# Patient Record
Sex: Male | Born: 1975 | Race: Black or African American | Hispanic: No | Marital: Married | Smoking: Former smoker
Health system: Southern US, Community
[De-identification: ages and names within clinical notes are randomized; demographics above are authoritative.]

## PROBLEM LIST (undated history)

## (undated) DIAGNOSIS — I10 Essential (primary) hypertension: Secondary | ICD-10-CM

---

## 2000-01-07 ENCOUNTER — Emergency Department (HOSPITAL_COMMUNITY): Admission: EM | Admit: 2000-01-07 | Discharge: 2000-01-08 | Payer: Self-pay | Admitting: Emergency Medicine

## 2000-01-07 ENCOUNTER — Encounter: Payer: Self-pay | Admitting: Emergency Medicine

## 2004-07-14 ENCOUNTER — Emergency Department (HOSPITAL_COMMUNITY): Admission: EM | Admit: 2004-07-14 | Discharge: 2004-07-14 | Payer: Self-pay | Admitting: Family Medicine

## 2007-05-08 ENCOUNTER — Emergency Department (HOSPITAL_COMMUNITY): Admission: EM | Admit: 2007-05-08 | Discharge: 2007-05-09 | Payer: Self-pay | Admitting: Emergency Medicine

## 2008-04-29 ENCOUNTER — Emergency Department (HOSPITAL_COMMUNITY): Admission: EM | Admit: 2008-04-29 | Discharge: 2008-04-29 | Payer: Self-pay | Admitting: Emergency Medicine

## 2009-06-25 ENCOUNTER — Emergency Department (HOSPITAL_COMMUNITY): Admission: EM | Admit: 2009-06-25 | Discharge: 2009-06-25 | Payer: Self-pay | Admitting: Family Medicine

## 2010-11-09 ENCOUNTER — Inpatient Hospital Stay (INDEPENDENT_AMBULATORY_CARE_PROVIDER_SITE_OTHER)
Admission: RE | Admit: 2010-11-09 | Discharge: 2010-11-09 | Disposition: A | Discharge status: Home / Self Care | Payer: Self-pay | Source: Ambulatory Visit | Attending: Family Medicine | Admitting: Family Medicine

## 2010-11-09 DIAGNOSIS — S335XXA Sprain of ligaments of lumbar spine, initial encounter: Secondary | ICD-10-CM

## 2011-04-27 ENCOUNTER — Emergency Department (HOSPITAL_COMMUNITY): Payer: Self-pay

## 2011-04-27 ENCOUNTER — Emergency Department (HOSPITAL_COMMUNITY)
Admission: EM | Admit: 2011-04-27 | Discharge: 2011-04-27 | Disposition: A | Discharge status: Home / Self Care | Payer: Self-pay | Attending: Emergency Medicine | Admitting: Emergency Medicine

## 2011-04-27 ENCOUNTER — Encounter (HOSPITAL_COMMUNITY): Payer: Self-pay | Admitting: *Deleted

## 2011-04-27 DIAGNOSIS — F17200 Nicotine dependence, unspecified, uncomplicated: Secondary | ICD-10-CM | POA: Insufficient documentation

## 2011-04-27 DIAGNOSIS — R109 Unspecified abdominal pain: Secondary | ICD-10-CM | POA: Insufficient documentation

## 2011-04-27 LAB — URINALYSIS, ROUTINE W REFLEX MICROSCOPIC
Bilirubin Urine: NEGATIVE
Hgb urine dipstick: NEGATIVE
Specific Gravity, Urine: 1.02 (ref 1.005–1.030)
pH: 5.5 (ref 5.0–8.0)

## 2011-04-27 MED ORDER — HYDROCODONE-ACETAMINOPHEN 5-500 MG PO TABS: 1.0000 | ORAL_TABLET | Freq: Four times a day (QID) | ORAL | Status: AC | PRN

## 2011-04-27 MED ORDER — KETOROLAC TROMETHAMINE 60 MG/2ML IM SOLN
60.0000 mg | Freq: Once | INTRAMUSCULAR | Status: AC
  Administered 2011-04-27: 60 mg via INTRAMUSCULAR
  Filled 2011-04-27: qty 2

## 2011-04-27 NOTE — ED Notes (Signed)
The pt has had  Lt flank pain since he sneezed approx  One hour ago

## 2011-04-27 NOTE — ED Notes (Signed)
Pt reports having (L) sided flank pain since sneezing tonight.  Reports that he felt a pop.  Pt tender on palpation, tender when moving.  Skin warm, dry and intact.  Denies urinary symptoms.

## 2011-04-27 NOTE — Discharge Instructions (Signed)
Flank Pain Flank pain refers to pain that is located on the side of the body between the upper abdomen and the back. It can be caused by many things. CAUSES  Some of the more common causes of flank pain include:  Muscle strain.   Muscle spasms.   A disease of your spine (vertebral disk disease).   A lung infection (pneumonia).   Fluid around your lungs (pulmonary edema).   A kidney infection.   Kidney stones.   A very painful skin rash on only one side of your body (shingles).   Gallbladder disease.  DIAGNOSIS  Blood tests, urine tests, and X-rays may help your caregiver determine what is wrong. TREATMENT  The treatment of pain depends on the cause. Your caregiver will determine what treatment will work best for you. HOME CARE INSTRUCTIONS   Home care will depend on the cause of your pain.   Some medications may help relieve the pain. Take medication for relief of pain as directed by your caregiver.   Tell your caregiver about any changes in your pain.   Follow up with your caregiver.  SEEK IMMEDIATE MEDICAL CARE IF:   Your pain is not controlled with medication.   The pain increases.   You have abdominal pain.   You have shortness of breath.   You have persistent nausea or vomiting.   You have swelling in your abdomen.   You feel faint or pass out.   You have a temperature by mouth above 102 F (38.9 C), not controlled by medicine.  MAKE SURE YOU:   Understand these instructions.   Will watch your condition.   Will get help right away if you are not doing well or get worse.  Document Released: 03/17/2005 Document Revised: 01/13/2011 Document Reviewed: 07/11/2009 [employer] Patient Information 2012 [employer]. 

## 2011-04-27 NOTE — ED Provider Notes (Signed)
History     CSN: [id number]  Arrival date & time 04/27/11  0143   First MD Initiated Contact with Patient 04/27/11 0500      Chief Complaint  Patient presents with  . Flank Pain    (Consider location/radiation/quality/duration/timing/severity/associated sxs/prior treatment) Patient is a 36 y.o. male presenting with flank pain. The history is provided by the patient.  Flank Pain This is a new problem. The current episode started 6 to 12 hours ago. The problem occurs constantly. The problem has not changed since onset.Exacerbated by: sneeze, cough, deep breath. The symptoms are relieved by nothing. He has tried nothing for the symptoms.    History reviewed. No pertinent past medical history.  History reviewed. No pertinent past surgical history.  History reviewed. No pertinent family history.  History  Substance Use Topics  . Smoking status: Current Everyday Smoker  . Smokeless tobacco: Not on file  . Alcohol Use: Yes      Review of Systems  Genitourinary: Positive for flank pain.  All other systems reviewed and are negative.    Allergies  Review of patient's allergies indicates no known allergies.  Home Medications   Current Outpatient Rx  Name Route Sig Dispense Refill  . ASPIRIN-CAFFEINE 500-32.5 MG PO TABS Oral Take 1 tablet by mouth 2 (two) times daily as needed. For pain      BP 138/90  Pulse 74  Temp(Src) 97.4 F (36.3 C) (Oral)  Resp 20  SpO2 99%  Physical Exam  Nursing note and vitals reviewed. Constitutional: He appears well-developed and well-nourished. No distress.  HENT:  Head: Normocephalic and atraumatic.  Neck: Normal range of motion. Neck supple.  Cardiovascular: Normal rate and regular rhythm.   Pulmonary/Chest: Effort normal and breath sounds normal.  Abdominal: Soft. Bowel sounds are normal.  Musculoskeletal: Normal range of motion.  Neurological: He is alert.  Skin: Skin is warm and dry. He is not diaphoretic.    ED Course    Procedures (including critical care time)  Labs Reviewed - No data to display No results found.   No diagnosis found.    MDM  The ct looks okay.  The pain appears musculoskeletal in nature.  He will be discharged to home with pain meds, time.  To return prn if worsens.        Geoffery Lyons, MD 04/27/11 [phone]

## 2011-11-18 ENCOUNTER — Emergency Department (INDEPENDENT_AMBULATORY_CARE_PROVIDER_SITE_OTHER)
Admission: EM | Admit: 2011-11-18 | Discharge: 2011-11-18 | Disposition: A | Discharge status: Home / Self Care | Payer: Self-pay | Source: Home / Self Care | Attending: Emergency Medicine | Admitting: Emergency Medicine

## 2011-11-18 ENCOUNTER — Emergency Department (HOSPITAL_COMMUNITY)
Admit: 2011-11-18 | Discharge: 2011-11-18 | Disposition: A | Discharge status: Home / Self Care | Payer: Self-pay | Source: Ambulatory Visit | Attending: Emergency Medicine | Admitting: Emergency Medicine

## 2011-11-18 ENCOUNTER — Encounter (HOSPITAL_COMMUNITY): Payer: Self-pay | Admitting: Emergency Medicine

## 2011-11-18 DIAGNOSIS — M7989 Other specified soft tissue disorders: Secondary | ICD-10-CM | POA: Insufficient documentation

## 2011-11-18 DIAGNOSIS — M25519 Pain in unspecified shoulder: Secondary | ICD-10-CM | POA: Insufficient documentation

## 2011-11-18 DIAGNOSIS — W19XXXA Unspecified fall, initial encounter: Secondary | ICD-10-CM | POA: Insufficient documentation

## 2011-11-18 DIAGNOSIS — S93409A Sprain of unspecified ligament of unspecified ankle, initial encounter: Secondary | ICD-10-CM

## 2011-11-18 MED ORDER — HYDROMORPHONE HCL PF 1 MG/ML IJ SOLN
INTRAMUSCULAR | Status: AC
  Filled 2011-11-18: qty 2

## 2011-11-18 MED ORDER — OXYCODONE-ACETAMINOPHEN 5-325 MG PO TABS: ORAL_TABLET | ORAL | Status: DC

## 2011-11-18 MED ORDER — ONDANSETRON HCL 4 MG/2ML IJ SOLN
4.0000 mg | Freq: Once | INTRAMUSCULAR | Status: AC
  Administered 2011-11-18: 4 mg via INTRAMUSCULAR

## 2011-11-18 MED ORDER — ONDANSETRON HCL 4 MG/2ML IJ SOLN
INTRAMUSCULAR | Status: AC
  Filled 2011-11-18: qty 2

## 2011-11-18 MED ORDER — HYDROMORPHONE HCL PF 1 MG/ML IJ SOLN
2.0000 mg | Freq: Once | INTRAMUSCULAR | Status: AC
  Administered 2011-11-18: 2 mg via INTRAMUSCULAR

## 2011-11-18 NOTE — ED Notes (Signed)
Discussed ordered medications: hydromorphone and zofran with ramon, cma and ordering physician, dr Lorenz Coaster.

## 2011-11-18 NOTE — ED Notes (Signed)
Pt c/o left foot injy since 13:30.. Says that cement blocks (about 100lbs) fell on his left foot... Pt twisted his ankle... Sx include: swelling, tender, unsteady gait, unable to put pressure, limited ROM.

## 2011-11-18 NOTE — ED Provider Notes (Signed)
Chief Complaint  Patient presents with  . Foot Injury    History of Present Illness:   Victor Dominguez is a 36 year old male who injured his left ankle and foot today. He was helping a family member doing some digging, when a large piece of concrete and on his foot and ankle, twisting his ankle and bending it into inversion. Has been swollen since then over the lateral foot and lateral malleolus. He's unable to bear weight. The pain is severe. He denies any numbness or tingling. It hurts to move the ankle.  Review of Systems:  Other than noted above, the patient denies any of the following symptoms: Systemic:  No fevers, chills, sweats, or aches.  No fatigue or tiredness. Musculoskeletal:  No joint pain, arthritis, bursitis, swelling, back pain, or neck pain. Neurological:  No muscular weakness, paresthesias, headache, or trouble with speech or coordination.  No dizziness.  PMFSH:  Past medical history, family history, social history, meds, and allergies were reviewed.  Physical Exam:   Vital signs:  BP 164/92  Pulse 93  Temp 98.5 F (36.9 C) (Oral)  Resp 16  SpO2 97% Gen:  Alert and oriented times 3.  In no distress. Musculoskeletal: There is tenderness to palpation over the tip of the lateral malleolus along with some swelling but no bruising or deformity. There is also some swelling and pain to palpation over the lateral aspect of the foot, at the base of the fifth metatarsal. There is no pain medially. It hurts to move the ankle. Pulses are full. Sensation is intact. He has good capillary refill. Otherwise, all joints had a full a ROM with no swelling, bruising or deformity.  No edema, pulses full. Extremities were warm and pink.  Capillary refill was brisk.  Skin:  Clear, warm and dry.  No rash. Neuro:  Alert and oriented times 3.  Muscle strength was normal.  Sensation was intact to light touch.   Radiology:  Dg Ankle Complete Left  11/18/2011  *RADIOLOGY REPORT*  Clinical Data: Fall,  trauma, pain  LEFT ANKLE COMPLETE - 3+ VIEW  Comparison: None.  Findings: Mild swelling laterally.  Normal alignment without fracture.  Intact malleoli, talus and calcaneus.  IMPRESSION: Lateral swelling.  No acute osseous abnormality   Original Report Authenticated By: Judie Petit. Ruel Favors, M.D.    Dg Foot Complete Left  11/18/2011  *RADIOLOGY REPORT*  Clinical Data: Fall, trauma  LEFT FOOT - COMPLETE 3+ VIEW  Comparison: None.  Findings: Normal alignment.  No fracture.  Preserved joint spaces. No radiographic soft tissue swelling or foreign body demonstrated.  IMPRESSION: No acute finding.   Original Report Authenticated By: Judie Petit. Ruel Favors, M.D.    I reviewed the images independently and personally and concur with the radiologist's findings.  Course in Urgent Care Center:   He was put in an ASO brace and given crutches.  Assessment:  The encounter diagnosis was Ankle sprain.  Plan:   1.  The following meds were prescribed:   New Prescriptions   OXYCODONE-ACETAMINOPHEN (PERCOCET) 5-325 MG PER TABLET    1 to 2 tablets every 6 hours as needed for pain.   2.  The patient was instructed in symptomatic care, including rest and activity, elevation, application of ice and compression.  Appropriate handouts were given. 3.  The patient was told to return if becoming worse in any way, if no better in 3 or 4 days, and given some red flag symptoms that would indicate earlier return.   4.  The patient was told to follow up with Dr. Venita Dominguez if no better in 2 weeks.  Victor Likes, MD 11/18/11 2106

## 2012-10-05 ENCOUNTER — Emergency Department (HOSPITAL_COMMUNITY): Payer: Self-pay

## 2012-10-05 ENCOUNTER — Encounter (HOSPITAL_COMMUNITY): Payer: Self-pay | Admitting: Emergency Medicine

## 2012-10-05 ENCOUNTER — Emergency Department (HOSPITAL_COMMUNITY)
Admission: EM | Admit: 2012-10-05 | Discharge: 2012-10-05 | Disposition: A | Discharge status: Home / Self Care | Payer: Self-pay | Attending: Emergency Medicine | Admitting: Emergency Medicine

## 2012-10-05 DIAGNOSIS — N200 Calculus of kidney: Secondary | ICD-10-CM | POA: Insufficient documentation

## 2012-10-05 DIAGNOSIS — F17200 Nicotine dependence, unspecified, uncomplicated: Secondary | ICD-10-CM | POA: Insufficient documentation

## 2012-10-05 LAB — BASIC METABOLIC PANEL
CO2: 21 mEq/L (ref 19–32)
Calcium: 8.6 mg/dL (ref 8.4–10.5)
Chloride: 106 mEq/L (ref 96–112)
Sodium: 138 mEq/L (ref 135–145)

## 2012-10-05 LAB — URINALYSIS W MICROSCOPIC + REFLEX CULTURE
Glucose, UA: NEGATIVE mg/dL
Specific Gravity, Urine: 1.014 (ref 1.005–1.030)

## 2012-10-05 MED ORDER — ONDANSETRON HCL 4 MG/2ML IJ SOLN
4.0000 mg | Freq: Once | INTRAMUSCULAR | Status: AC
  Administered 2012-10-05: 4 mg via INTRAVENOUS
  Filled 2012-10-05: qty 2

## 2012-10-05 MED ORDER — HYDROMORPHONE HCL PF 1 MG/ML IJ SOLN
1.0000 mg | Freq: Once | INTRAMUSCULAR | Status: AC
  Administered 2012-10-05: 1 mg via INTRAVENOUS
  Filled 2012-10-05: qty 1

## 2012-10-05 MED ORDER — KETOROLAC TROMETHAMINE 30 MG/ML IJ SOLN
30.0000 mg | Freq: Once | INTRAMUSCULAR | Status: AC
  Administered 2012-10-05: 30 mg via INTRAVENOUS
  Filled 2012-10-05: qty 1

## 2012-10-05 MED ORDER — OXYCODONE-ACETAMINOPHEN 5-325 MG PO TABS: 1.0000 | ORAL_TABLET | ORAL | Status: DC | PRN

## 2012-10-05 MED ORDER — SODIUM CHLORIDE 0.9 % IV BOLUS (SEPSIS)
1000.0000 mL | INTRAVENOUS | Status: AC
  Administered 2012-10-05: 1000 mL via INTRAVENOUS

## 2012-10-05 MED ORDER — TAMSULOSIN HCL 0.4 MG PO CAPS: 0.4000 mg | ORAL_CAPSULE | Freq: Every day | ORAL | Status: DC

## 2012-10-05 MED ORDER — HYDROMORPHONE HCL PF 1 MG/ML IJ SOLN
1.0000 mg | INTRAMUSCULAR | Status: AC
  Administered 2012-10-05: 1 mg via INTRAVENOUS
  Filled 2012-10-05: qty 1

## 2012-10-05 NOTE — ED Notes (Addendum)
Pt states decreased nausea but no relief of pain with dilaudid.  Dr Romeo Apple notified.

## 2012-10-05 NOTE — ED Notes (Signed)
Pt has a urinal and knows we need a urine sample from him 

## 2012-10-05 NOTE — ED Provider Notes (Signed)
CSN: [id number]     Arrival date & time 10/05/12  4098 History   First MD Initiated Contact with Patient 10/05/12 1008     Chief Complaint  Patient presents with  . Flank Pain   (Consider location/radiation/quality/duration/timing/severity/associated sxs/prior Treatment) Patient is a 37 y.o. male presenting with flank pain. The history is provided by the patient.  Flank Pain This is a new problem. The current episode started 1 to 2 hours ago. The problem occurs constantly. The problem has not changed since onset.Pertinent negatives include no chest pain, no abdominal pain, no headaches and no shortness of breath. Nothing aggravates the symptoms. Nothing relieves the symptoms. He has tried nothing for the symptoms. The treatment provided no relief.    History reviewed. No pertinent past medical history. History reviewed. No pertinent past surgical history. History reviewed. No pertinent family history. History  Substance Use Topics  . Smoking status: Current Every Day Smoker  . Smokeless tobacco: Not on file  . Alcohol Use: Yes    Review of Systems  Constitutional: Negative for fever.  HENT: Negative for rhinorrhea, drooling and neck pain.   Eyes: Negative for pain.  Respiratory: Negative for cough and shortness of breath.   Cardiovascular: Negative for chest pain and leg swelling.  Gastrointestinal: Negative for nausea, vomiting, abdominal pain and diarrhea.  Genitourinary: Positive for flank pain. Negative for dysuria and hematuria.  Musculoskeletal: Negative for gait problem.  Skin: Negative for color change.  Neurological: Negative for numbness and headaches.  Hematological: Negative for adenopathy.  Psychiatric/Behavioral: Negative for behavioral problems.  All other systems reviewed and are negative.    Allergies  Review of patient's allergies indicates no known allergies.  Home Medications   Current Outpatient Rx  Name  Route  Sig  Dispense  Refill  .  Aspirin-Caffeine (BAYER BACK & BODY PAIN EX ST) 500-32.5 MG TABS   Oral   Take 1 tablet by mouth 2 (two) times daily as needed. For pain         . oxyCODONE-acetaminophen (PERCOCET) 5-325 MG per tablet      1 to 2 tablets every 6 hours as needed for pain.   20 tablet   0    BP 177/111  Pulse 74  Temp(Src) 98.3 F (36.8 C) (Oral)  Resp 14  Ht 6\' 4"  (1.93 m)  Wt 240 lb (108.863 kg)  BMI 29.23 kg/m2  SpO2 99% Physical Exam  Nursing note and vitals reviewed. Constitutional: He is oriented to person, place, and time. He appears well-developed and well-nourished.  HENT:  Head: Normocephalic and atraumatic.  Right Ear: External ear normal.  Left Ear: External ear normal.  Nose: Nose normal.  Mouth/Throat: Oropharynx is clear and moist. No oropharyngeal exudate.  Eyes: Conjunctivae and EOM are normal. Pupils are equal, round, and reactive to light.  Neck: Normal range of motion. Neck supple.  Cardiovascular: Normal rate, regular rhythm, normal heart sounds and intact distal pulses.  Exam reveals no gallop and no friction rub.   No murmur heard. Pulmonary/Chest: Effort normal and breath sounds normal. No respiratory distress. He has no wheezes.  Abdominal: Soft. Bowel sounds are normal. He exhibits no distension. There is no tenderness. There is no rebound and no guarding.  Musculoskeletal: Normal range of motion. He exhibits no edema and no tenderness.  No CVA ttp.   Neurological: He is alert and oriented to person, place, and time.  Skin: Skin is warm and dry.  Psychiatric: He has a normal mood and  affect. His behavior is normal.    ED Course  Procedures (including critical care time) Labs Review Labs Reviewed  URINALYSIS W MICROSCOPIC + REFLEX CULTURE - Abnormal; Notable for the following:    Hgb urine dipstick MODERATE (*)    Ketones, ur 15 (*)    All other components within normal limits  BASIC METABOLIC PANEL - Abnormal; Notable for the following:    Glucose, Bld 126  (*)    All other components within normal limits  CBC WITH DIFFERENTIAL   Imaging Review Ct Abdomen Pelvis Wo Contrast  10/05/2012   *RADIOLOGY REPORT*  Clinical Data: Right flank pain  CT ABDOMEN AND PELVIS WITHOUT CONTRAST  Technique:  Multidetector CT imaging of the abdomen and pelvis was performed following the standard protocol without intravenous contrast.  Comparison: April 27, 2011.  Findings: 6 mm nodule is seen laterally in the left lower lobe which is unchanged compared to prior exam.  No abnormalities seen involve the liver, spleen or pancreas.  No gallstones are noted. Adrenal glands appear normal.  Left kidney appears normal.  Minimal right hydroureteronephrosis is noted secondary to a 3 mm calculus at the right ureterovesical junction.  Urinary bladder appears normal.  The appendix appears normal.  Fat containing left inguinal hernia is noted which is unchanged compared to prior exam.  IMPRESSION: Stable 6 mm nodule seen in the left lower lobe since March, 2013; unenhanced follow-up chest CT scan in 6-12 months is recommended. Minimal right hydroureteronephrosis is noted secondary to a 3 mm calculus at the right ureterovesical junction.  Stable fat containing left inguinal hernia compared to prior exam.   Original Report Authenticated By: Lupita Raider.,  M.D.    MDM   1. Nephrolithiasis    38 year old male who presents with sudden onset right flank pain and proximally and this morning. The patient is afebrile and vital signs are unremarkable here. No focal tenderness on exam. No history of kidney stones but will workup for this.  2:37 PM: Cbc hemolyzed and not redrawn. Do not think this would change pt's care. UA neg for infection, pt continues to appear well. I have discussed the diagnosis/risks/treatment options with the patient and believe the pt to be eligible for discharge home to follow-up with Urology as needed. We also discussed returning to the ED immediately if new or  worsening sx occur. We discussed the sx which are most concerning (e.g., worsening pain, vomiting, fever) that necessitate immediate return. Any new prescriptions provided to the patient are listed below.  New Prescriptions   OXYCODONE-ACETAMINOPHEN (PERCOCET) 5-325 MG PER TABLET    Take 1 tablet by mouth every 4 (four) hours as needed for pain.   TAMSULOSIN (FLOMAX) 0.4 MG CAPS CAPSULE    Take 1 capsule (0.4 mg total) by mouth daily.     Junius Argyle, MD 10/05/12 [phone]

## 2012-10-05 NOTE — ED Notes (Signed)
Pt c/o right sided flank pain starting this am; pt diaphoretic and laying in floor upon my arrival moaning

## 2016-11-13 ENCOUNTER — Encounter (HOSPITAL_COMMUNITY): Payer: Self-pay | Admitting: *Deleted

## 2016-11-13 ENCOUNTER — Emergency Department (HOSPITAL_COMMUNITY): Payer: Self-pay

## 2016-11-13 ENCOUNTER — Emergency Department (HOSPITAL_COMMUNITY)
Admission: EM | Admit: 2016-11-13 | Discharge: 2016-11-13 | Disposition: A | Discharge status: Home / Self Care | Payer: Self-pay | Attending: Physician Assistant | Admitting: Physician Assistant

## 2016-11-13 DIAGNOSIS — Y999 Unspecified external cause status: Secondary | ICD-10-CM | POA: Insufficient documentation

## 2016-11-13 DIAGNOSIS — I10 Essential (primary) hypertension: Secondary | ICD-10-CM | POA: Insufficient documentation

## 2016-11-13 DIAGNOSIS — M5442 Lumbago with sciatica, left side: Secondary | ICD-10-CM | POA: Insufficient documentation

## 2016-11-13 DIAGNOSIS — Y939 Activity, unspecified: Secondary | ICD-10-CM | POA: Insufficient documentation

## 2016-11-13 DIAGNOSIS — F17200 Nicotine dependence, unspecified, uncomplicated: Secondary | ICD-10-CM | POA: Insufficient documentation

## 2016-11-13 DIAGNOSIS — Y929 Unspecified place or not applicable: Secondary | ICD-10-CM | POA: Insufficient documentation

## 2016-11-13 DIAGNOSIS — Z79899 Other long term (current) drug therapy: Secondary | ICD-10-CM | POA: Insufficient documentation

## 2016-11-13 MED ORDER — IBUPROFEN 400 MG PO TABS
600.0000 mg | ORAL_TABLET | Freq: Once | ORAL | Status: AC
  Administered 2016-11-13: 600 mg via ORAL
  Filled 2016-11-13: qty 1

## 2016-11-13 MED ORDER — CYCLOBENZAPRINE HCL 10 MG PO TABS
10.0000 mg | ORAL_TABLET | Freq: Once | ORAL | Status: AC
  Administered 2016-11-13: 10 mg via ORAL
  Filled 2016-11-13: qty 1

## 2016-11-13 MED ORDER — CYCLOBENZAPRINE HCL 10 MG PO TABS: 10.0000 mg | ORAL_TABLET | Freq: Every evening | ORAL | 0 refills | Status: DC | PRN

## 2016-11-13 NOTE — Discharge Instructions (Signed)
While in the ED your blood pressure was high.  Please follow up with your primary care doctor or the wellness clinic for repeat evaluation as you may need medication.  High blood pressure can cause long term, potentially serious, damage if left untreated.   Please take Ibuprofen (Advil, motrin) and Tylenol (acetaminophen) to relieve your pain.  You may take up to 600 MG (3 pills) of normal strength ibuprofen every 8 hours as needed.  In between doses of ibuprofen you make take tylenol, up to 1,000 mg (two extra strength pills).  Do not take more than 3,000 mg tylenol in a 24 hour period.  Please check all medication labels as many medications such as pain and cold medications may contain tylenol.  Do not drink alcohol while taking these medications.  Do not take other NSAID'S while taking ibuprofen (such as aleve or naproxen).  Please take ibuprofen with food to decrease stomach upset.  Today you received medications that may make you sleepy or impair your ability to make decisions.  For the next 24 hours please do not drive, operate heavy machinery, care for a small child with out another adult present, or perform any activities that may cause harm to you or someone else if you were to fall asleep or be impaired.   You are being prescribed a medication which may make you sleepy. Please follow up of listed precautions for at least 24 hours after taking one dose.  The best way to get rid of muscle pain is by taking NSAIDS, using heat, massage therapy, and gentle stretching/range of motion exercises.   It is important that you wear a seat belt every time you are in a car.
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
## 2018-09-25 ENCOUNTER — Ambulatory Visit (HOSPITAL_COMMUNITY)
Admission: EM | Admit: 2018-09-25 | Discharge: 2018-09-25 | Disposition: A | Discharge status: Home / Self Care | Payer: Self-pay | Attending: Family Medicine | Admitting: Family Medicine

## 2018-09-25 ENCOUNTER — Encounter (HOSPITAL_COMMUNITY): Payer: Self-pay

## 2018-09-25 ENCOUNTER — Other Ambulatory Visit: Payer: Self-pay

## 2018-09-25 DIAGNOSIS — M791 Myalgia, unspecified site: Secondary | ICD-10-CM

## 2018-09-25 MED ORDER — NAPROXEN 500 MG PO TABS: 500.0000 mg | ORAL_TABLET | Freq: Two times a day (BID) | ORAL | 0 refills | Status: DC

## 2018-09-25 MED ORDER — CYCLOBENZAPRINE HCL 10 MG PO TABS: 10.0000 mg | ORAL_TABLET | Freq: Two times a day (BID) | ORAL | 0 refills | Status: DC | PRN
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
## 2018-09-25 NOTE — Discharge Instructions (Signed)
Take the medication as prescribed  Be aware the muscle relaxant may make you drowsy. Do not operate heavy machinery. Gentle stretching, heat or massage Take the naproxen with food Work note given to rest for a few days Follow up as needed for continued or worsening symptoms
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
## 2018-12-30 ENCOUNTER — Other Ambulatory Visit: Payer: Self-pay

## 2018-12-30 ENCOUNTER — Ambulatory Visit (HOSPITAL_COMMUNITY)
Admission: EM | Admit: 2018-12-30 | Discharge: 2018-12-30 | Disposition: A | Discharge status: Home / Self Care | Payer: Self-pay | Attending: Family Medicine | Admitting: Family Medicine

## 2018-12-30 ENCOUNTER — Encounter (HOSPITAL_COMMUNITY): Payer: Self-pay

## 2018-12-30 ENCOUNTER — Emergency Department (HOSPITAL_COMMUNITY)
Admission: EM | Admit: 2018-12-30 | Discharge: 2018-12-31 | Disposition: A | Discharge status: Home / Self Care | Payer: Self-pay | Attending: Emergency Medicine | Admitting: Emergency Medicine

## 2018-12-30 ENCOUNTER — Encounter (HOSPITAL_COMMUNITY): Payer: Self-pay | Admitting: Emergency Medicine

## 2018-12-30 DIAGNOSIS — R519 Headache, unspecified: Secondary | ICD-10-CM | POA: Insufficient documentation

## 2018-12-30 DIAGNOSIS — F17200 Nicotine dependence, unspecified, uncomplicated: Secondary | ICD-10-CM | POA: Insufficient documentation

## 2018-12-30 DIAGNOSIS — Z79899 Other long term (current) drug therapy: Secondary | ICD-10-CM | POA: Insufficient documentation

## 2018-12-30 DIAGNOSIS — R030 Elevated blood-pressure reading, without diagnosis of hypertension: Secondary | ICD-10-CM | POA: Insufficient documentation

## 2018-12-30 MED ORDER — BUTALBITAL-APAP-CAFFEINE 50-325-40 MG PO TABS
2.0000 | ORAL_TABLET | Freq: Once | ORAL | Status: AC
  Administered 2018-12-31: 2 via ORAL
  Filled 2018-12-30: qty 2

## 2018-12-30 MED ORDER — DEXAMETHASONE SODIUM PHOSPHATE 10 MG/ML IJ SOLN
10.0000 mg | Freq: Once | INTRAMUSCULAR | Status: AC
  Administered 2018-12-31: 10 mg via INTRAVENOUS
  Filled 2018-12-30: qty 1

## 2018-12-30 MED ORDER — METOCLOPRAMIDE HCL 5 MG/ML IJ SOLN
10.0000 mg | Freq: Once | INTRAMUSCULAR | Status: AC
  Administered 2018-12-31: 10 mg via INTRAVENOUS
  Filled 2018-12-30: qty 2

## 2018-12-30 MED ORDER — DIPHENHYDRAMINE HCL 50 MG/ML IJ SOLN
12.5000 mg | Freq: Once | INTRAMUSCULAR | Status: AC
  Administered 2018-12-31: 12.5 mg via INTRAVENOUS
  Filled 2018-12-30: qty 1

## 2018-12-30 MED ORDER — SODIUM CHLORIDE 0.9 % IV BOLUS: 500.0000 mL | Freq: Once | INTRAVENOUS | Status: DC

## 2018-12-30 NOTE — ED Notes (Signed)
Patient is being discharged from the Urgent [place] and sent to the Emergency Department. Per Dr Meda Coffee, patient is stable but in need of higher level of care due to having "worst HA of his life" and need for imaging studies. Patient is aware and verbalizes understanding of plan of care.  Vitals:   12/30/18 1749  BP: (!) 157/115  Pulse: 66  Resp: 18  Temp: 98.2 F (36.8 C)  SpO2: 100%
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
## 2018-12-30 NOTE — ED Triage Notes (Signed)
Pt states he has been having headaches for a week or more now. Pt states the pain is serve.

## 2018-12-30 NOTE — ED Notes (Signed)
Pt b/p is 157/115 this was reported to Dr. Meda Coffee. Pt cc headaches getting worst.

## 2018-12-30 NOTE — ED Triage Notes (Signed)
Patient reports persistent headache for 2 days unrelieved by OTC pain medication , denies head injury , no fever or chills . No emesis or photophobia .

## 2018-12-30 NOTE — ED Provider Notes (Signed)
[facility] EMERGENCY DEPARTMENT Provider Note   CSN: [id number] Arrival date & time: 12/30/18  1823     History   Chief Complaint Chief Complaint  Patient presents with  . Headache    HPI Victor Dominguez is a 43 y.o. male presents to the ER from urgent care for evaluation of headache.  Reports he has been getting headaches intermittently for the last several months, close to 1 year.  Usually Eksir strength Tylenol will help the headaches.  He developed gradual onset, initially mild frontal headache 2 days ago that has been persistent, gradually worsening.  Described as moderate to severe.  He denies any other symptoms including photophobia, phonophobia, nausea, vomiting, neck pain or stiffness, unilateral loss of sensation or weakness, difficulty with speech or swallowing or balance.  No head trauma or neck trauma.  No anticoagulants.  No known history of aneurysms.  Per urgent care MD note patient was triaged and due to the severity of his pain sent to the ER for further evaluation.     HPI  History reviewed. No pertinent past medical history.  Patient Active Problem List   Diagnosis Date Noted  . Nephrolithiasis 10/05/2012    History reviewed. No pertinent surgical history.      Home Medications    Prior to Admission medications   Medication Sig Start Date End Date Taking? Authorizing Provider  cyclobenzaprine (FLEXERIL) 10 MG tablet Take 1 tablet (10 mg total) by mouth 2 (two) times daily as needed for muscle spasms. 09/25/18   Dahlia Byes A, NP  naproxen (NAPROSYN) 500 MG tablet Take 1 tablet (500 mg total) by mouth 2 (two) times daily. 09/25/18   Janace Aris, NP    Family History No family history on file.  Social History Social History   Tobacco Use  . Smoking status: Current Some Day Smoker    Packs/day: 1.00  . Smokeless tobacco: Never Used  Substance Use Topics  . Alcohol use: Yes  . Drug use: Not on file     Allergies    Patient has no known allergies.   Review of Systems Review of Systems  Neurological: Positive for headaches.  All other systems reviewed and are negative.    Physical Exam Updated Vital Signs BP (!) 172/104 (BP Location: Right Arm)   Pulse 65   Temp 97.9 F (36.6 C) (Oral)   Resp 16   Ht 6\' 2"  (1.88 m)   Wt 108.9 kg   SpO2 99%   BMI 30.81 kg/m   Physical Exam Constitutional:      Appearance: He is well-developed. He is not toxic-appearing.     Comments: Patient asleep, in [facility]ris bed.  Easily arousable.  HENT:     Head: Normocephalic and atraumatic.     Right Ear: External ear normal.     Left Ear: External ear normal.     Nose: Nose normal.     Comments: No tenderness with sinus percussion Eyes:     General: Lids are normal.     Conjunctiva/sclera: Conjunctivae normal.     Comments: Unable to visualize back of eye  Neck:     Comments: No c spine spinous process or muscular tenderness  Full PROM of neck w/o rigidity  No meningeal signs  Cardiovascular:     Rate and Rhythm: Normal rate and regular rhythm.     Heart sounds: Normal heart sounds.  Pulmonary:     Effort: Pulmonary effort is normal.  Breath sounds: Normal breath sounds.  Lymphadenopathy:     Comments: No cervical adenopathy  Skin:    General: Skin is warm and dry.     Capillary Refill: Capillary refill takes less than 2 seconds.     Findings: No rash.  Neurological:     Mental Status: He is alert.     GCS: GCS eye subscore is 4. GCS verbal subscore is 5. GCS motor subscore is 6.     Comments:   Mental Status: Patient is awake, alert, oriented to person, place, year, and situation.  Patient is able to give a clear and coherent history. Speech is fluent and clear without dysarthria or aphasia. No signs of neglect.  Cranial Nerves: I not tested II visual fields full bilaterally. PERRL.  Unable to visualize posterior eye. III, IV, VI EOMs intact without ptosis or diplopia  V sensation to light  touch intact in all 3 divisions of trigeminal nerve bilaterally  VII facial movements symmetric bilaterally VIII hearing intact to voice/conversation  IX, X no uvula deviation, symmetric rise of soft palate/uvula XI 5/5 SCM and trapezius strength bilaterally  XII tongue protrusion midline, symmetric L/R movements  Motor: Strength 5/5 in upper/lower extremities .   Sensation to light touch intact in face, upper/lower extremities. No pronator drift. No leg drop.  Cerebellar: No ataxia with finger to nose.  Steady gait.  Psychiatric:        Speech: Speech normal.        Behavior: Behavior normal.        Thought Content: Thought content normal.        Judgment: Judgment normal.      ED Treatments / Results  Labs (all labs ordered are listed, but only abnormal results are displayed) Labs Reviewed - No data to display  EKG None  Radiology No results found.  Procedures Procedures (including critical care time)  Medications Ordered in ED Medications  sodium chloride 0.9 % bolus 500 mL (has no administration in time range)  metoCLOPramide (REGLAN) injection 10 mg (10 mg Intravenous Given 12/31/18 0010)  diphenhydrAMINE (BENADRYL) injection 12.5 mg (12.5 mg Intravenous Given 12/31/18 0007)  dexamethasone (DECADRON) injection 10 mg (10 mg Intravenous Given 12/31/18 0014)  butalbital-acetaminophen-caffeine (FIORICET) 50-325-40 MG per tablet 2 tablet (2 tablets Oral Given 12/31/18 0004)     Initial Impression / Assessment and Plan / ED Course  I have reviewed the triage vital signs and the nursing notes.  Pertinent labs & imaging results that were available during my care of the patient were reviewed by me and considered in my medical decision making (see chart for details).  Urgent care note reviewed.  Due to patient's description of severe headache, blurred vision and feeling off balance he was sent to the ER for evaluation.  Clinically, and on exam patient is in no  significant distress.  He is asleep in [facility] bed but easily arousable.  His headaches have been ongoing for months. Reports intermittent blurred vision that is fleeting, lasting a few seconds throughout the day that spontaneously resolves.  No current blurred vision, diplopia.  Denies any ataxia or difficulty walking.  No other red flag features like anticoagulant use, history of aneurysm, head trauma, neck trauma, fever, neck pain or rigidity, neuro/stroke symptoms.    Exam is benign as above, no neuro deficits.  Hypertensive here but this is not new and has been hypertensive with every previous ER visit.    0015: Highest on differential diagnosis is primary headache,  tension type headache, migraine.  No clinical signs/symptoms to suggest meningitis. No neck pain or meningismus, distal neuro deficits and vascular/neck dissection less likely. He reports gradual onset and no other concerning features.  Discussed with patient my clinical suspicion for subarachnoid hemorrhage is low given his benign exam, I recommended migraine IV medicines and reassessment.  If there is significant clinical improvement and no other neuro/clinical decline I feel patient is appropriate for discharge with Fioricet and neurology/headache specialist follow-up.  Consider head CT and potentially lumbar puncture if there is clinical decline or no improvement after migraine medicines.  Discussed case with EDP Nanavati who agrees with ER management.    Patient will be handed off to oncoming EDPA who will reassess after migraine medicines.  Will need antihypertensive at discharge.    Final Clinical Impressions(s) / ED Diagnoses   Final diagnoses:  Bad headache  Elevated blood pressure reading without diagnosis of hypertension    ED Discharge Orders    None       Kinnie Feil, PA-C 12/31/18 Jinger Neighbors, MD 01/01/19 1756

## 2018-12-30 NOTE — ED Notes (Signed)
Roselyn Reef, ED Charge RN notified of pt; First Nurse unavailable.

## 2018-12-30 NOTE — Discharge Instructions (Addendum)
You need to be evaluated in the emergency department Go directly there

## 2018-12-31 MED ORDER — BUTALBITAL-APAP-CAFFEINE 50-325-40 MG PO TABS: 1.0000 | ORAL_TABLET | Freq: Four times a day (QID) | ORAL | 0 refills | Status: AC | PRN

## 2018-12-31 MED ORDER — HYDROCHLOROTHIAZIDE 25 MG PO TABS: 25.0000 mg | ORAL_TABLET | Freq: Every day | ORAL | 0 refills | Status: DC

## 2018-12-31 NOTE — Discharge Instructions (Signed)
1. Medications: Fioricet, HCTZ, usual home medications 2. Treatment: rest, drink plenty of fluids,  3. Follow Up: Please followup with your primary doctor in 3 days for repeat blood pressure check.  If you do not have a primary care physician please call the [facility] and schedule an ER follow-up.  P additionally please schedule a follow-up appointment with [facility] neurology for further evaluation of your headache.  Please return to the ER for headaches associated with vision changes, persistent vomiting, fevers or other concerns.

## 2018-12-31 NOTE — ED Notes (Signed)
Pt verbalized understanding of d/c instructions, medication and followup. Pt denies pain at this time. Pt ambulatory to [place] with steady gait. NAD

## 2018-12-31 NOTE — ED Provider Notes (Signed)
Care assumed from Victor Dominguez.  Please see her full H&P.  In short,  WHALEN TROMPETER is a 43 y.o. male presents from Urgent Care for gradual onset frontal headache onset 2 days ago.  Normally resolve with tylenol, but not today.  No red flags from initial provider.  No sinus symptoms.  Normal neuro exam on initial physical exam.  Previous visits show HTN but pt is not on any medications.   Physical Exam  BP (!) 172/104 (BP Location: Right Arm)   Pulse 65   Temp 97.9 F (36.6 C) (Oral)   Resp 16   Ht 6\' 2"  (1.88 m)   Wt 108.9 kg   SpO2 99%   BMI 30.81 kg/m   Physical Exam Vitals signs and nursing note reviewed.  Constitutional:      General: He is not in acute distress.    Appearance: He is well-developed.  HENT:     Head: Normocephalic.  Eyes:     General: No scleral icterus.    Conjunctiva/sclera: Conjunctivae normal.  Neck:     Musculoskeletal: Normal range of motion.  Cardiovascular:     Rate and Rhythm: Normal rate.  Pulmonary:     Effort: Pulmonary effort is normal.  Musculoskeletal: Normal range of motion.  Skin:    General: Skin is warm and dry.  Neurological:     Mental Status: He is alert.     ED Course/Procedures   Clinical Course as of Dec 30 144  Mon Dec 31, 2018  0011 Plan: treat with meds. D/c home with Fioricet and neuro follow-up.     [HM]  0012 HTN on arrival  BP(!): 172/104 [HM]  0132 Patient reports his headache has improved and he feels much better.  He is ready for discharge home.   [HM]  5956 Blood Pressure is not improved.  Records reviewed and patient has been hypertensive on every previous visit including before 2018.  He is not currently on any medications.  BP(!): 180/92 [HM]    Clinical Course User Index [HM] Lamar Naef, Jarrett Soho, PA-C    Procedures  MDM   Presents with intermittent headaches.  No red flags for initial provider.  Medications given here in the emergency department with resolution of headache.  Patient  reports feeling significantly better.  He will need close neurology follow-up.  Discussed with him reasons to return to the emergency department including red flags for headaches.  Patient also with significant hypertension.  It appears the patient has been hypertensive for some time.  We will start him on HCTZ and have him follow with primary care.  Patient referred to [facility] health and wellness.    Bad headache  Elevated blood pressure reading without diagnosis of hypertension   Charman Blasco, Gwenlyn Perking 12/31/18 [place], Delice Bison, DO 12/31/18 3875

## 2019-12-30 ENCOUNTER — Other Ambulatory Visit: Payer: Self-pay

## 2019-12-30 ENCOUNTER — Ambulatory Visit (INDEPENDENT_AMBULATORY_CARE_PROVIDER_SITE_OTHER): Payer: Self-pay

## 2019-12-30 DIAGNOSIS — Z23 Encounter for immunization: Secondary | ICD-10-CM

## 2020-01-25 ENCOUNTER — Ambulatory Visit (INDEPENDENT_AMBULATORY_CARE_PROVIDER_SITE_OTHER): Payer: Self-pay

## 2020-01-25 DIAGNOSIS — Z23 Encounter for immunization: Secondary | ICD-10-CM

## 2020-01-25 NOTE — Progress Notes (Signed)
   Covid-19 Vaccination Clinic  Name:  Victor Dominguez    MRN: [id number] DOB: 1975/02/21  01/25/2020  [name] was observed post Covid-19 immunization for 15 minutes without incident. He was provided with Vaccine Information Sheet and instruction to access the V-Safe system.   [name] was instructed to call 911 with any severe reactions post vaccine: Marland Kitchen Difficulty breathing  . Swelling of face and throat  . A fast heartbeat  . A bad rash all over body  . Dizziness and weakness   Immunizations Administered    Name Date Dose VIS Date Route   Pfizer COVID-19 Vaccine 01/25/2020 11:12 AM 0.3 mL 11/27/2019 Intramuscular   Manufacturer: [employer], [employer]   Lot: [id number]   NDC: [address]-3

## 2020-04-17 ENCOUNTER — Encounter (HOSPITAL_COMMUNITY): Payer: Self-pay | Admitting: Medical Oncology

## 2020-04-17 ENCOUNTER — Other Ambulatory Visit: Payer: Self-pay

## 2020-04-17 ENCOUNTER — Ambulatory Visit (HOSPITAL_COMMUNITY)
Admission: EM | Admit: 2020-04-17 | Discharge: 2020-04-17 | Disposition: A | Discharge status: Home / Self Care | Payer: Self-pay | Attending: Medical Oncology | Admitting: Medical Oncology

## 2020-04-17 DIAGNOSIS — R519 Headache, unspecified: Secondary | ICD-10-CM

## 2020-04-17 DIAGNOSIS — I159 Secondary hypertension, unspecified: Secondary | ICD-10-CM

## 2020-04-17 MED ORDER — AMLODIPINE BESYLATE 5 MG PO TABS: 5.0000 mg | ORAL_TABLET | Freq: Every day | ORAL | 0 refills | Status: DC

## 2020-04-17 NOTE — ED Notes (Signed)
BP reported to [place], PA.  

## 2020-04-17 NOTE — ED Provider Notes (Signed)
[facility]    CSN: [id number] Arrival date & time: 04/17/20  1022      History   Chief Complaint Chief Complaint  Patient presents with  . Headache    HPI Victor Dominguez is a 45 y.o. male.   HPI   Headache: Pt presents with a headache for the past 2 days. Symptoms are rated as 10/10 in nature.  He states that this is very similar to his normal headaches but has lasted more than 1 day which is slightly atypical for him.  Usually when he goes to sleep they go away.  He notes associated elevated BP and blurry vision which is common for him per patient. He has been seen in the ER and UC for this a few times in 2020. It appears that previously symptoms were suspected to be secondary to his elevated BP and he was treated with HTCZ.  He states that he was not given a migraine cocktail at this time as he "does not tolerate needles ".  He states that his headaches were well controlled when he was on blood pressure medication previously but states that he does not have a primary care provider at this time so he has been out of his medication for quite some time.  He denies head injury, neck stiffness, vomiting, dizziness, double vision, chest pains, SOB.   History reviewed. No pertinent past medical history.  Patient Active Problem List   Diagnosis Date Noted  . Nephrolithiasis 10/05/2012    History reviewed. No pertinent surgical history.   Home Medications    Prior to Admission medications   Medication Sig Start Date End Date Taking? Authorizing Provider  cyclobenzaprine (FLEXERIL) 10 MG tablet Take 1 tablet (10 mg total) by mouth 2 (two) times daily as needed for muscle spasms. 09/25/18   Dahlia Byes A, NP  hydrochlorothiazide (HYDRODIURIL) 25 MG tablet Take 1 tablet (25 mg total) by mouth daily. 12/31/18   Muthersbaugh, Dahlia Client, PA-C  naproxen (NAPROSYN) 500 MG tablet Take 1 tablet (500 mg total) by mouth 2 (two) times daily. 09/25/18   Janace Aris, NP    Family  History History reviewed. No pertinent family history.  Social History Social History   Tobacco Use  . Smoking status: Current Some Day Smoker    Packs/day: 1.00  . Smokeless tobacco: Never Used  Substance Use Topics  . Alcohol use: Yes     Allergies   Patient has no known allergies.   Review of Systems Review of Systems  As stated above in HPI Physical Exam Triage Vital Signs ED Triage Vitals  Enc Vitals Group     BP 04/17/20 1041 (!) 169/107     Pulse Rate 04/17/20 1041 81     Resp 04/17/20 1041 20     Temp 04/17/20 1041 98 F (36.7 C)     Temp Source 04/17/20 1041 Oral     SpO2 04/17/20 1041 99 %     Weight --      Height --      Head Circumference --      Peak Flow --      Pain Score 04/17/20 1043 10     Pain Loc --      Pain Edu? --      Excl. in GC? --    No data found.  Updated Vital Signs BP (!) 169/107 (BP Location: Right Arm)   Pulse 81   Temp 98 F (36.7 C) (Oral)  Resp 20   SpO2 99%   Visual Acuity Right Eye Distance: 20/20 (Without glasses) Left Eye Distance: 20/20 (Without glasses) Bilateral Distance: 20/20 (Without glasses)  Right Eye Near:   Left Eye Near:    Bilateral Near:     Physical Exam Vitals and nursing note reviewed.  Constitutional:      General: He is not in acute distress.    Appearance: He is not ill-appearing, toxic-appearing or diaphoretic.  HENT:     Head: Normocephalic and atraumatic.     Mouth/Throat:     Mouth: Mucous membranes are moist.  Eyes:     General: No visual field deficit or scleral icterus.    Extraocular Movements: Extraocular movements intact.     Right eye: Normal extraocular motion and no nystagmus.     Left eye: Normal extraocular motion and no nystagmus.     Pupils: Pupils are equal, round, and reactive to light. Pupils are equal.     Right eye: Pupil is round and reactive.     Left eye: Pupil is round and reactive.  Cardiovascular:     Rate and Rhythm: Normal rate and regular rhythm.      Heart sounds: Normal heart sounds. No murmur heard. No friction rub. No gallop.   Pulmonary:     Effort: Pulmonary effort is normal. No respiratory distress.     Breath sounds: Normal breath sounds. No stridor. No wheezing, rhonchi or rales.  Chest:     Chest wall: No tenderness.  Musculoskeletal:     Cervical back: Normal range of motion and neck supple. No rigidity.  Lymphadenopathy:     Cervical: No cervical adenopathy.  Skin:    General: Skin is warm.     Coloration: Skin is not pale.  Neurological:     Mental Status: He is alert and oriented to person, place, and time.     Cranial Nerves: No cranial nerve deficit, dysarthria or facial asymmetry.     Sensory: No sensory deficit.     Motor: No weakness.     Coordination: Romberg sign negative. Coordination normal.     Gait: Gait normal.     Deep Tendon Reflexes: Reflexes normal.  Psychiatric:        Mood and Affect: Mood normal.      UC Treatments / Results  Labs (all labs ordered are listed, but only abnormal results are displayed) Labs Reviewed - No data to display  EKG   Radiology No results found.  Procedures Procedures (including critical care time)  Medications Ordered in UC Medications - No data to display  Initial Impression / Assessment and Plan / UC Course  I have reviewed the triage vital signs and the nursing notes.  Pertinent labs & imaging results that were available during my care of the patient were reviewed by me and considered in my medical decision making (see chart for details).     New.  Given his history this is likely secondary to elevated blood pressure reading.  We will send him home with amlodipine and I will have him follow-up with our office in 3 days to ensure that his blood pressure is better improved and that his headache has resolved.  We discussed strong red flag signs and symptoms that would warrant him further work-up.  In the meantime I have encouraged him to stay  hydrated with water, to follow a low-salt diet.   Final Clinical Impressions(s) / UC Diagnoses   Final diagnoses:  None  Discharge Instructions   None    ED Prescriptions    None     PDMP not reviewed this encounter.   Rushie Chestnut, [place] 04/22/20 1806

## 2020-04-17 NOTE — ED Triage Notes (Addendum)
Pt reports headache, blurry vision  x 2 days. Denies photophobia, nausea, vomiting. Tylenol gives no relief.

## 2020-08-27 ENCOUNTER — Other Ambulatory Visit: Payer: Self-pay

## 2020-08-27 ENCOUNTER — Emergency Department (HOSPITAL_COMMUNITY): Payer: Self-pay

## 2020-08-27 ENCOUNTER — Encounter (HOSPITAL_COMMUNITY): Payer: Self-pay | Admitting: Pharmacy Technician

## 2020-08-27 ENCOUNTER — Emergency Department (HOSPITAL_COMMUNITY)
Admission: EM | Admit: 2020-08-27 | Discharge: 2020-08-28 | Disposition: A | Discharge status: Home / Self Care | Payer: Self-pay | Attending: Emergency Medicine | Admitting: Emergency Medicine

## 2020-08-27 DIAGNOSIS — I10 Essential (primary) hypertension: Secondary | ICD-10-CM | POA: Insufficient documentation

## 2020-08-27 DIAGNOSIS — J189 Pneumonia, unspecified organism: Secondary | ICD-10-CM | POA: Insufficient documentation

## 2020-08-27 DIAGNOSIS — R000 Tachycardia, unspecified: Secondary | ICD-10-CM | POA: Insufficient documentation

## 2020-08-27 DIAGNOSIS — Z20822 Contact with and (suspected) exposure to covid-19: Secondary | ICD-10-CM | POA: Insufficient documentation

## 2020-08-27 DIAGNOSIS — F17210 Nicotine dependence, cigarettes, uncomplicated: Secondary | ICD-10-CM | POA: Insufficient documentation

## 2020-08-27 DIAGNOSIS — Z79899 Other long term (current) drug therapy: Secondary | ICD-10-CM | POA: Insufficient documentation

## 2020-08-27 LAB — CBC WITH DIFFERENTIAL/PLATELET
Abs Immature Granulocytes: 0.05 10*3/uL (ref 0.00–0.07)
Basophils Absolute: 0 10*3/uL (ref 0.0–0.1)
Basophils Relative: 0 %
Eosinophils Absolute: 0 10*3/uL (ref 0.0–0.5)
Eosinophils Relative: 0 %
HCT: 46.9 % (ref 39.0–52.0)
Hemoglobin: 16.9 g/dL (ref 13.0–17.0)
Immature Granulocytes: 0 %
Lymphocytes Relative: 10 %
Lymphs Abs: 1.2 10*3/uL (ref 0.7–4.0)
MCH: 31.3 pg (ref 26.0–34.0)
MCHC: 36 g/dL (ref 30.0–36.0)
MCV: 86.9 fL (ref 80.0–100.0)
Monocytes Absolute: 1.4 10*3/uL — ABNORMAL HIGH (ref 0.1–1.0)
Monocytes Relative: 13 %
Neutro Abs: 8.6 10*3/uL — ABNORMAL HIGH (ref 1.7–7.7)
Neutrophils Relative %: 77 %
Platelets: 132 10*3/uL — ABNORMAL LOW (ref 150–400)
RBC: 5.4 MIL/uL (ref 4.22–5.81)
RDW: 13.4 % (ref 11.5–15.5)
WBC: 11.2 10*3/uL — ABNORMAL HIGH (ref 4.0–10.5)
nRBC: 0 % (ref 0.0–0.2)

## 2020-08-27 LAB — RESP PANEL BY RT-PCR (FLU A&B, COVID) ARPGX2
Influenza A by PCR: NEGATIVE
Influenza B by PCR: NEGATIVE
SARS Coronavirus 2 by RT PCR: NEGATIVE

## 2020-08-27 LAB — BASIC METABOLIC PANEL
Anion gap: 8 (ref 5–15)
BUN: 7 mg/dL (ref 6–20)
CO2: 24 mmol/L (ref 22–32)
Calcium: 8.8 mg/dL — ABNORMAL LOW (ref 8.9–10.3)
Chloride: 99 mmol/L (ref 98–111)
Creatinine, Ser: 1.16 mg/dL (ref 0.61–1.24)
GFR, Estimated: >60 mL/min (ref >60)
Glucose, Bld: 116 mg/dL — ABNORMAL HIGH (ref 70–99)
Potassium: 3 mmol/L — ABNORMAL LOW (ref 3.5–5.1)
Sodium: 131 mmol/L — ABNORMAL LOW (ref 135–145)

## 2020-08-27 MED ORDER — ACETAMINOPHEN 500 MG PO TABS
1000.0000 mg | ORAL_TABLET | Freq: Once | ORAL | Status: AC
  Administered 2020-08-27: 1000 mg via ORAL
  Filled 2020-08-27: qty 2

## 2020-08-27 MED ORDER — ACETAMINOPHEN 325 MG PO TABS
650.0000 mg | ORAL_TABLET | Freq: Once | ORAL | Status: AC
  Administered 2020-08-27: 650 mg via ORAL
  Filled 2020-08-27: qty 2

## 2020-08-27 MED ORDER — ACETAMINOPHEN 325 MG PO TABS: 325.0000 mg | ORAL_TABLET | Freq: Once | ORAL | Status: DC

## 2020-08-27 NOTE — ED Triage Notes (Signed)
Pt to the ED via pov with reports of fever X2 days along with body aches. Pt denies chest pain, cough, sore throat or shob.

## 2020-08-27 NOTE — ED Provider Notes (Signed)
Emergency Medicine Provider Triage Evaluation Note  Victor Dominguez , a 45 y.o. male  was evaluated in triage.  Pt complains of fevers, general body aches that started 2 days ago, he denies nasal congestion, sore throat, cough, chest pain, shortness of breath, abdominal pain, nausea, vomiting, diarrhea.  He denies recent sick contacts, up-to-date on his COVID vaccines, has no other complaints at this time..  Review of Systems  Positive: Fevers, body aches Negative: Shortness of breath, chest pain  Physical Exam  BP (!) 162/122   Pulse (!) 118   Temp (!) 102.8 F (39.3 C) (Oral)   Resp 16   SpO2 96%  Gen:   Awake, no distress   Resp:  Normal effort  MSK:   Moves extremities without difficulty  Other:    Medical Decision Making  Medically screening exam initiated at 4:26 PM.  Appropriate orders placed.  Victor Dominguez was informed that the remainder of the evaluation will be completed by another provider, this initial triage assessment does not replace that evaluation, and the importance of remaining in the ED until their evaluation is complete.  Presents with fevers and body aches, lab work, imaging have been ordered, patient will need further work-up   Victor Dominguez 08/27/20 1627    Victor Rhine, MD 08/28/20 [phone]

## 2020-08-28 MED ORDER — DOXYCYCLINE HYCLATE 100 MG PO TABS
100.0000 mg | ORAL_TABLET | Freq: Once | ORAL | Status: AC
  Administered 2020-08-28: 100 mg via ORAL
  Filled 2020-08-28: qty 1

## 2020-08-28 MED ORDER — DOXYCYCLINE HYCLATE 100 MG PO CAPS: 100.0000 mg | ORAL_CAPSULE | Freq: Two times a day (BID) | ORAL | 0 refills | Status: DC

## 2020-08-28 MED ORDER — AMOXICILLIN 500 MG PO CAPS: 1000.0000 mg | ORAL_CAPSULE | Freq: Three times a day (TID) | ORAL | 0 refills | Status: DC

## 2020-08-28 MED ORDER — IBUPROFEN 400 MG PO TABS
400.0000 mg | ORAL_TABLET | Freq: Once | ORAL | Status: AC
  Administered 2020-08-28: 400 mg via ORAL
  Filled 2020-08-28: qty 1

## 2020-08-28 MED ORDER — AMLODIPINE BESYLATE 5 MG PO TABS: 5.0000 mg | ORAL_TABLET | Freq: Every day | ORAL | 0 refills | Status: DC

## 2020-08-28 NOTE — ED Provider Notes (Signed)
[facility] EMERGENCY DEPARTMENT Provider Note   CSN: [id number] Arrival date & time: 08/27/20  1342     History Chief Complaint  Patient presents with   Fever    Victor Dominguez is a 45 y.o. male.  The history is provided by the patient and the spouse.  Fever Severity:  Moderate Onset quality:  Gradual Duration:  3 days Timing:  Intermittent Progression:  Worsening Chronicity:  New Relieved by:  Acetaminophen Worsened by:  Nothing Associated symptoms: chills, headaches and myalgias   Associated symptoms: no chest pain, no cough, no diarrhea, no rash and no vomiting   Risk factors: no recent travel and no sick contacts   Patient with history of hypertension presents with fever, myalgias for the past 3 days.  Patient denies any significant cough or sore throat.  No chest pain or shortness of breath.  No vomiting or diarrhea.  No recent travel.  No sick contacts.  He has a smoker. 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 Reports he is vaccinated for COVID      PMH-HTN Patient Active Problem List   Diagnosis Date Noted   Nephrolithiasis 10/05/2012          Social History   Tobacco Use   Smoking status: Some Days    Packs/day: 1.00    Types: Cigarettes   Smokeless tobacco: Never  Substance Use Topics   Alcohol use: Yes    Home Medications Prior to Admission medications   Medication Sig Start Date End Date Taking? Authorizing Provider  amLODipine (NORVASC) 5 MG tablet Take 1 tablet (5 mg total) by mouth daily. 08/28/20  Yes [name], MD  amoxicillin (AMOXIL) 500 MG capsule Take 2 capsules (1,000 mg total) by mouth 3 (three) times daily. 08/28/20  Yes [name], MD  doxycycline (VIBRAMYCIN) 100 MG capsule Take 1 capsule (100 mg total) by mouth 2 (two) times daily. One po bid x 7 days 08/28/20  Yes [name], MD  cyclobenzaprine (FLEXERIL) 10 MG tablet Take 1 tablet (10  mg total) by mouth 2 (two) times daily as needed for muscle spasms. 09/25/18   Dahlia Byes A, NP  hydrochlorothiazide (HYDRODIURIL) 25 MG tablet Take 1 tablet (25 mg total) by mouth daily. 12/31/18 04/17/20  Muthersbaugh, Dahlia Client, PA-C    Allergies    Patient has no known allergies.  Review of Systems   Review of Systems  Constitutional:  Positive for chills and fever.  Respiratory:  Negative for cough.   Cardiovascular:  Negative for chest pain.  Gastrointestinal:  Negative for diarrhea and vomiting.  Musculoskeletal:  Positive for myalgias.  Skin:  Negative for rash.  Neurological:  Positive for headaches.  All other systems reviewed and are negative.  Physical Exam Updated Vital Signs BP (!) 164/104   Pulse (!) 109   Temp (!) 100.7 F (38.2 C) (Oral)   Resp 13   SpO2 96%   Physical Exam CONSTITUTIONAL: Well developed/well nourished HEAD: Normocephalic/atraumatic EYES: EOMI/PERRL ENMT: Mucous membranes moist NECK: supple no meningeal signs SPINE/BACK:entire spine nontender CV: S1/S2 noted, no murmurs/rubs/gallops noted LUNGS: Lungs are clear to auscultation bilaterally, no apparent distress ABDOMEN: soft, nontender, no rebound or guarding, bowel sounds noted throughout abdomen GU:no cva tenderness NEURO: Pt is awake/alert/appropriate, moves all extremitiesx4.  No facial droop.   EXTREMITIES: pulses normal/equal, full ROM SKIN: warm, color normal, no rash PSYCH: no abnormalities of mood noted, alert and oriented to situation  ED Results / Procedures / Treatments   Labs (all labs ordered are listed, but only abnormal results are displayed) Labs Reviewed  CBC WITH DIFFERENTIAL/PLATELET - Abnormal; Notable for the following components:      Result Value   WBC 11.2 (*)    Platelets 132 (*)    Neutro Abs 8.6 (*)    Monocytes Absolute 1.4 (*)    All other components within normal limits  BASIC METABOLIC PANEL - Abnormal; Notable for the following components:   Sodium  131 (*)    Potassium 3.0 (*)    Glucose, Bld 116 (*)    Calcium 8.8 (*)    All other components within normal limits  RESP PANEL BY RT-PCR (FLU A&B, COVID) [id number]    EKG EKG Interpretation  Date/Time:  Thursday August 27 2020 16:30:57 EDT Ventricular Rate:  116 PR Interval:  132 QRS Duration: 82 QT Interval:  326 QTC Calculation: 453 R Axis:   41 Text Interpretation: Sinus tachycardia Right atrial enlargement Borderline ECG Confirmed by [name] ([id number]) on 08/28/2020 3:25:47 AM  Radiology DG Chest 2 View  Result Date: 08/27/2020 CLINICAL DATA:  Pt to the ED with reports of fever X2 days along with body aches. EXAM: CHEST - 2 VIEW COMPARISON:  CT abdomen pelvis 06/05/2012. FINDINGS: The heart size and mediastinal contours are within normal limits. Left lower lung zone peripheral focal consolidation. Question right peripheral lower lung zone vague focal consolidation. Diffusely coarsened interstitial markings with no overt pulmonary edema. No pleural effusion. No pneumothorax. No acute osseous abnormality. IMPRESSION: Left lower lung zone pneumonia, possibly multifocal. Followup PA and lateral chest X-ray is recommended in 3-4 weeks following therapy to ensure resolution and exclude underlying malignancy. Electronically Signed   By: Tish Frederickson M.D.   On: 08/27/2020 17:14    Procedures Procedures   Medications Ordered in ED Medications  acetaminophen (TYLENOL) tablet 650 mg (650 mg Oral Given 08/27/20 1630)  acetaminophen (TYLENOL) tablet 1,000 mg (1,000 mg Oral Given 08/27/20 2206)  doxycycline (VIBRA-TABS) tablet 100 mg (100 mg Oral Given 08/28/20 0409)  ibuprofen (ADVIL) tablet 400 mg (400 mg Oral Given 08/28/20 0451)    ED Course  I have reviewed the triage vital signs and the nursing notes.  Pertinent labs & imaging results that were available during my care of the patient were reviewed by me and considered in my medical decision making (see chart for details).     MDM Rules/Calculators/A&P                           Patient presents with myalgias, fevers with past 3 days.  Patient found to had pneumonia on x-ray.  He is negative for COVID-19 No hypoxia on room air.  No increased work of breathing.  He is safe for discharge home and outpatient management.  He was encouraged to stop smoking. Will provide dual antibiotic therapy.  He also requests refill for his hypertension medicines and referral to PCP. We discussed return precautions.  Patient is motivated to follow-up as an outpatient and will need repeat x-ray as an outpatient in 1 month Final Clinical Impression(s) / ED Diagnoses Final diagnoses:  Community acquired pneumonia, unspecified laterality    Rx / DC Orders ED Discharge Orders          Ordered    amoxicillin (AMOXIL) 500 MG capsule  3 times daily        08/28/20 0456    doxycycline (  VIBRAMYCIN) 100 MG capsule  2 times daily        08/28/20 0456    amLODipine (NORVASC) 5 MG tablet  Daily        08/28/20 0500             [name], MD 08/28/20 [phone]

## 2020-10-01 NOTE — Progress Notes (Signed)
Patient did not show for appointment.   

## 2020-10-02 ENCOUNTER — Other Ambulatory Visit: Payer: Self-pay

## 2020-10-02 ENCOUNTER — Encounter: Payer: Self-pay | Admitting: Family

## 2020-10-02 DIAGNOSIS — Z7689 Persons encountering health services in other specified circumstances: Secondary | ICD-10-CM

## 2022-10-25 ENCOUNTER — Observation Stay (HOSPITAL_COMMUNITY)
Admission: EM | Admit: 2022-10-25 | Discharge: 2022-10-27 | Disposition: A | Discharge status: Home / Self Care | Payer: 59 | Attending: Internal Medicine | Admitting: Internal Medicine

## 2022-10-25 ENCOUNTER — Emergency Department (HOSPITAL_COMMUNITY): Payer: 59

## 2022-10-25 ENCOUNTER — Encounter (HOSPITAL_COMMUNITY): Payer: Self-pay | Admitting: Emergency Medicine

## 2022-10-25 ENCOUNTER — Other Ambulatory Visit: Payer: Self-pay

## 2022-10-25 DIAGNOSIS — E041 Nontoxic single thyroid nodule: Secondary | ICD-10-CM | POA: Diagnosis not present

## 2022-10-25 DIAGNOSIS — R519 Headache, unspecified: Secondary | ICD-10-CM | POA: Diagnosis not present

## 2022-10-25 DIAGNOSIS — Z79899 Other long term (current) drug therapy: Secondary | ICD-10-CM

## 2022-10-25 DIAGNOSIS — I10 Essential (primary) hypertension: Secondary | ICD-10-CM | POA: Diagnosis not present

## 2022-10-25 DIAGNOSIS — M542 Cervicalgia: Secondary | ICD-10-CM | POA: Diagnosis not present

## 2022-10-25 DIAGNOSIS — I160 Hypertensive urgency: Secondary | ICD-10-CM | POA: Diagnosis not present

## 2022-10-25 DIAGNOSIS — F17210 Nicotine dependence, cigarettes, uncomplicated: Secondary | ICD-10-CM | POA: Diagnosis not present

## 2022-10-25 DIAGNOSIS — Z720 Tobacco use: Secondary | ICD-10-CM

## 2022-10-25 HISTORY — DX: Essential (primary) hypertension: I10

## 2022-10-25 LAB — CBC
HCT: 46.2 % (ref 39.0–52.0)
Hemoglobin: 16.4 g/dL (ref 13.0–17.0)
MCH: 30.7 pg (ref 26.0–34.0)
MCHC: 35.5 g/dL (ref 30.0–36.0)
MCV: 86.4 fL (ref 80.0–100.0)
Platelets: 177 10*3/uL (ref 150–400)
RBC: 5.35 MIL/uL (ref 4.22–5.81)
RDW: 13 % (ref 11.5–15.5)
WBC: 6.5 10*3/uL (ref 4.0–10.5)
nRBC: 0 % (ref 0.0–0.2)

## 2022-10-25 LAB — COMPREHENSIVE METABOLIC PANEL
ALT: 38 U/L (ref 0–44)
AST: 22 U/L (ref 15–41)
Albumin: 4.8 g/dL (ref 3.5–5.0)
Alkaline Phosphatase: 62 U/L (ref 38–126)
Anion gap: 9 (ref 5–15)
BUN: 10 mg/dL (ref 6–20)
CO2: 23 mmol/L (ref 22–32)
Calcium: 8.9 mg/dL (ref 8.9–10.3)
Chloride: 104 mmol/L (ref 98–111)
Creatinine, Ser: 0.86 mg/dL (ref 0.61–1.24)
GFR, Estimated: >60 mL/min (ref >60)
Glucose, Bld: 113 mg/dL — ABNORMAL HIGH (ref 70–99)
Potassium: 3.5 mmol/L (ref 3.5–5.1)
Sodium: 136 mmol/L (ref 135–145)
Total Bilirubin: 1 mg/dL (ref 0.3–1.2)
Total Protein: 8 g/dL (ref 6.5–8.1)

## 2022-10-25 LAB — URINALYSIS, ROUTINE W REFLEX MICROSCOPIC
Bacteria, UA: NONE SEEN
Bilirubin Urine: NEGATIVE
Glucose, UA: NEGATIVE mg/dL
Hgb urine dipstick: NEGATIVE
Ketones, ur: NEGATIVE mg/dL
Leukocytes,Ua: NEGATIVE
Nitrite: NEGATIVE
Protein, ur: 30 mg/dL — AB
Specific Gravity, Urine: 1.012 (ref 1.005–1.030)
pH: 7 (ref 5.0–8.0)

## 2022-10-25 LAB — LIPASE, BLOOD: Lipase: 23 U/L (ref 11–51)

## 2022-10-25 MED ORDER — LABETALOL HCL 5 MG/ML IV SOLN
20.0000 mg | Freq: Once | INTRAVENOUS | Status: AC
  Administered 2022-10-25: 20 mg via INTRAVENOUS

## 2022-10-25 MED ORDER — LABETALOL HCL 5 MG/ML IV SOLN
INTRAVENOUS | Status: AC
  Filled 2022-10-25: qty 4

## 2022-10-25 MED ORDER — PROCHLORPERAZINE EDISYLATE 10 MG/2ML IJ SOLN
INTRAMUSCULAR | Status: AC
  Filled 2022-10-25: qty 2

## 2022-10-25 MED ORDER — DIPHENHYDRAMINE HCL 50 MG/ML IJ SOLN
INTRAMUSCULAR | Status: AC
  Filled 2022-10-25: qty 1

## 2022-10-25 MED ORDER — LABETALOL HCL 5 MG/ML IV SOLN
40.0000 mg | INTRAVENOUS | Status: AC | PRN
  Administered 2022-10-25: 40 mg via INTRAVENOUS

## 2022-10-25 MED ORDER — PROCHLORPERAZINE EDISYLATE 10 MG/2ML IJ SOLN
10.0000 mg | Freq: Once | INTRAMUSCULAR | Status: AC
  Administered 2022-10-25: 10 mg via INTRAVENOUS

## 2022-10-25 MED ORDER — LABETALOL HCL 5 MG/ML IV SOLN
INTRAVENOUS | Status: AC
  Administered 2022-10-25: 80 mg via INTRAVENOUS
  Filled 2022-10-25: qty 8

## 2022-10-25 MED ORDER — DIPHENHYDRAMINE HCL 50 MG/ML IJ SOLN
25.0000 mg | Freq: Once | INTRAMUSCULAR | Status: AC
  Administered 2022-10-25: 25 mg via INTRAVENOUS

## 2022-10-25 MED ORDER — LABETALOL HCL 5 MG/ML IV SOLN
80.0000 mg | INTRAVENOUS | Status: AC | PRN
  Filled 2022-10-25: qty 16

## 2022-10-25 MED ORDER — CLONIDINE HCL 0.1 MG PO TABS
0.2000 mg | ORAL_TABLET | Freq: Once | ORAL | Status: AC
  Administered 2022-10-26: 0.2 mg via ORAL
  Filled 2022-10-25: qty 2

## 2022-10-25 MED ORDER — HYDRALAZINE HCL 20 MG/ML IJ SOLN
10.0000 mg | Freq: Once | INTRAMUSCULAR | Status: AC
  Administered 2022-10-26: 10 mg via INTRAVENOUS
  Filled 2022-10-25: qty 1

## 2022-10-25 NOTE — ED Provider Notes (Incomplete)
[place] EMERGENCY DEPARTMENT AT [facility] Provider Note   CSN: [id number] Arrival date & time: 10/25/22  1853     History {Add pertinent medical, surgical, social history, OB history to HPI:1} Chief Complaint  Patient presents with   Headache   Emesis   Neck Pain    Victor Dominguez is a 47 y.o. male.  HPI    47 year old male comes in with chief complaint of headache, neck pain.  Patient has history of chronic headaches.  He also has history of hypertension, but he does not take any medications.  He does not have a PCP.  Patient states that he has chronic headaches.  He suffers with headaches 4-5 times a week.  The headaches are generalized.  He normally takes Excedrin for the headaches,    Home Medications Prior to Admission medications   Medication Sig Start Date End Date Taking? Authorizing Provider  aspirin-acetaminophen-caffeine (EXCEDRIN MIGRAINE) [phone] MG tablet Take 1 tablet by mouth every 6 (six) hours as needed for headache.   Yes [provider]  amLODipine (NORVASC) 5 MG tablet Take 1 tablet (5 mg total) by mouth daily. Patient not taking: Reported on 10/25/2022 08/28/20   [name], MD  amoxicillin (AMOXIL) 500 MG capsule Take 2 capsules (1,000 mg total) by mouth 3 (three) times daily. Patient not taking: Reported on 10/25/2022 08/28/20   [name], MD  cyclobenzaprine (FLEXERIL) 10 MG tablet Take 1 tablet (10 mg total) by mouth 2 (two) times daily as needed for muscle spasms. Patient not taking: Reported on 10/25/2022 09/25/18   Dahlia Byes A, NP  doxycycline (VIBRAMYCIN) 100 MG capsule Take 1 capsule (100 mg total) by mouth 2 (two) times daily. One po bid x 7 days Patient not taking: Reported on 10/25/2022 08/28/20   [name], MD  hydrochlorothiazide (HYDRODIURIL) 25 MG tablet Take 1 tablet (25 mg total) by mouth daily. 12/31/18 04/17/20  Muthersbaugh, Dahlia Client, PA-C      Allergies    Patient has no known  allergies.    Review of Systems   Review of Systems  Physical Exam Updated Vital Signs BP (!) 209/130   Pulse 71   Temp 98.1 F (36.7 C) (Oral)   Resp 15   Wt 107.5 kg   SpO2 100%   BMI 30.43 kg/m  Physical Exam  ED Results / Procedures / Treatments   Labs (all labs ordered are listed, but only abnormal results are displayed) Labs Reviewed  COMPREHENSIVE METABOLIC PANEL - Abnormal; Notable for the following components:      Result Value   Glucose, Bld 113 (*)    All other components within normal limits  URINALYSIS, ROUTINE W REFLEX MICROSCOPIC - Abnormal; Notable for the following components:   Protein, ur 30 (*)    All other components within normal limits  LIPASE, BLOOD  CBC    EKG EKG Interpretation Date/Time:  Tuesday October 25 2022 19:44:30 EDT Ventricular Rate:  70 PR Interval:  159 QRS Duration:  88 QT Interval:  426 QTC Calculation: 460 R Axis:   -19  Text Interpretation: Sinus rhythm Probable left atrial enlargement Probable left ventricular hypertrophy ST elev, probable normal early repol pattern No acute changes lateral TWI is new new ST elevation is likely early repo Confirmed by Derwood Kaplan [id number]) on 10/25/2022 9:58:11 PM  Radiology CT Head Wo Contrast  Result Date: 10/25/2022 CLINICAL DATA:  Headache 3rd rib focal EXAM: CT HEAD WITHOUT CONTRAST TECHNIQUE: Contiguous axial images were obtained from  the base of the skull through the vertex without intravenous contrast. RADIATION DOSE REDUCTION: This exam was performed according to the departmental dose-optimization program which includes automated exposure control, adjustment of the mA and/or kV according to patient size and/or use of iterative reconstruction technique. COMPARISON:  None Available. FINDINGS: Brain: No evidence of acute infarction, hemorrhage, hydrocephalus, extra-axial collection or mass lesion/mass effect. Vascular: No hyperdense vessel or unexpected calcification. Skull: Normal.  Negative for fracture or focal lesion. Sinuses/Orbits: The visualized paranasal sinuses are essentially clear. The mastoid air cells are unopacified. Radiopaque foreign body along the left superior orbital ridge. Other: None. IMPRESSION: Normal head CT. Radiopaque foreign body along the left superior orbital ridge. Electronically Signed   By: Charline Bills M.D.   On: 10/25/2022 20:28    Procedures Procedures  {Document cardiac monitor, telemetry assessment procedure when appropriate:1}  Medications Ordered in ED Medications  diphenhydrAMINE (BENADRYL) 50 MG/ML injection (has no administration in time range)  prochlorperazine (COMPAZINE) 10 MG/2ML injection (has no administration in time range)  labetalol (NORMODYNE) 5 MG/ML injection (has no administration in time range)  hydrALAZINE (APRESOLINE) injection 10 mg (has no administration in time range)  cloNIDine (CATAPRES) tablet 0.2 mg (has no administration in time range)  diphenhydrAMINE (BENADRYL) injection 25 mg (25 mg Intravenous Given 10/25/22 2208)  prochlorperazine (COMPAZINE) injection 10 mg (10 mg Intravenous Given 10/25/22 2209)  labetalol (NORMODYNE) injection 20 mg (20 mg Intravenous Given 10/25/22 2209)  labetalol (NORMODYNE) injection 40 mg (40 mg Intravenous Given 10/25/22 2244)    And  labetalol (NORMODYNE) injection 80 mg (80 mg Intravenous Given 10/25/22 2330)    ED Course/ Medical Decision Making/ A&P   {   Click here for ABCD2, HEART and other calculatorsREFRESH Note before signing :1}                              Medical Decision Making Amount and/or Complexity of Data Reviewed Labs: ordered.  Risk Prescription drug management.   ***  {Document critical care time when appropriate:1} {Document review of labs and clinical decision tools ie heart score, Chads2Vasc2 etc:1}  {Document your independent review of radiology images, and any outside records:1} {Document your discussion with family members, caretakers,  and with consultants:1} {Document social determinants of health affecting pt's care:1} {Document your decision making why or why not admission, treatments were needed:1} Final Clinical Impression(s) / ED Diagnoses Final diagnoses:  None    Rx / DC Orders ED Discharge Orders     None

## 2022-10-25 NOTE — ED Provider Triage Note (Signed)
Emergency Medicine Provider Triage Evaluation Note  JAMESANTHONY MARITATO , a 47 y.o. male  was evaluated in triage.  Pt complains of headache, nausea, vomiting.  Patient reports he has had ongoing headache in the last few months that got worse today.  Pain is all over his head and radiate out to the back of his neck.  Also endorses nausea and vomiting.  Denies any acute vision changes.  Endorses sensitivity to light..  Review of Systems  Positive: As above  Negative: As above  Physical Exam  BP (!) 227/158   Pulse 75   Temp 97.8 F (36.6 C) (Oral)   Resp 14   Wt 107.5 kg   SpO2 98%   BMI 30.43 kg/m  Gen:   Awake, no distress   Resp:  Normal effort  MSK:   Moves extremities without difficulty  Other:  No focal neurological deficit.  Medical Decision Making  Medically screening exam initiated at 7:38 PM.  Appropriate orders placed.  LLEWELYN KALINOSKI was informed that the remainder of the evaluation will be completed by another provider, this initial triage assessment does not replace that evaluation, and the importance of remaining in the ED until their evaluation is complete.     Jeanelle Malling, [place] 10/25/22 1939

## 2022-10-25 NOTE — ED Notes (Signed)
Patient has a urine culture in the main lab 

## 2022-10-25 NOTE — ED Triage Notes (Signed)
Patient presents due to headaches for a few days, light sensitivity, blurry vision, back pain and vomiting. He is unsure as to the cause. He does not have a history of migraines.

## 2022-10-26 ENCOUNTER — Emergency Department (HOSPITAL_COMMUNITY): Payer: 59

## 2022-10-26 DIAGNOSIS — E041 Nontoxic single thyroid nodule: Secondary | ICD-10-CM | POA: Diagnosis not present

## 2022-10-26 DIAGNOSIS — I10 Essential (primary) hypertension: Secondary | ICD-10-CM

## 2022-10-26 DIAGNOSIS — Z720 Tobacco use: Secondary | ICD-10-CM

## 2022-10-26 DIAGNOSIS — I160 Hypertensive urgency: Secondary | ICD-10-CM | POA: Diagnosis not present

## 2022-10-26 DIAGNOSIS — R519 Headache, unspecified: Secondary | ICD-10-CM | POA: Diagnosis not present

## 2022-10-26 DIAGNOSIS — F17210 Nicotine dependence, cigarettes, uncomplicated: Secondary | ICD-10-CM | POA: Diagnosis not present

## 2022-10-26 DIAGNOSIS — Z79899 Other long term (current) drug therapy: Secondary | ICD-10-CM | POA: Diagnosis not present

## 2022-10-26 DIAGNOSIS — M542 Cervicalgia: Secondary | ICD-10-CM | POA: Diagnosis not present

## 2022-10-26 HISTORY — DX: Nontoxic single thyroid nodule: E04.1

## 2022-10-26 HISTORY — DX: Essential (primary) hypertension: I10

## 2022-10-26 HISTORY — DX: Tobacco use: Z72.0

## 2022-10-26 LAB — CBC
HCT: 45.2 % (ref 39.0–52.0)
Hemoglobin: 15.8 g/dL (ref 13.0–17.0)
MCH: 30.4 pg (ref 26.0–34.0)
MCHC: 35 g/dL (ref 30.0–36.0)
MCV: 86.9 fL (ref 80.0–100.0)
Platelets: 175 10*3/uL (ref 150–400)
RBC: 5.2 MIL/uL (ref 4.22–5.81)
RDW: 13.2 % (ref 11.5–15.5)
WBC: 8.2 10*3/uL (ref 4.0–10.5)
nRBC: 0 % (ref 0.0–0.2)

## 2022-10-26 LAB — BASIC METABOLIC PANEL WITH GFR
Anion gap: 9 (ref 5–15)
BUN: 9 mg/dL (ref 6–20)
CO2: 26 mmol/L (ref 22–32)
Calcium: 9 mg/dL (ref 8.9–10.3)
Chloride: 101 mmol/L (ref 98–111)
Creatinine, Ser: 1.01 mg/dL (ref 0.61–1.24)
GFR, Estimated: >60 mL/min (ref >60)
Glucose, Bld: 112 mg/dL — ABNORMAL HIGH (ref 70–99)
Potassium: 3.7 mmol/L (ref 3.5–5.1)
Sodium: 136 mmol/L (ref 135–145)

## 2022-10-26 LAB — RAPID URINE DRUG SCREEN, HOSP PERFORMED
Amphetamines: NOT DETECTED
Barbiturates: NOT DETECTED
Benzodiazepines: NOT DETECTED
Cocaine: NOT DETECTED
Opiates: NOT DETECTED
Tetrahydrocannabinol: POSITIVE — AB

## 2022-10-26 LAB — MRSA NEXT GEN BY PCR, NASAL: MRSA by PCR Next Gen: NOT DETECTED

## 2022-10-26 LAB — HIV ANTIBODY (ROUTINE TESTING W REFLEX): HIV Screen 4th Generation wRfx: NONREACTIVE

## 2022-10-26 LAB — TSH: TSH: 1.513 u[IU]/mL (ref 0.350–4.500)

## 2022-10-26 MED ORDER — SENNOSIDES-DOCUSATE SODIUM 8.6-50 MG PO TABS: 1.0000 | ORAL_TABLET | Freq: Every evening | ORAL | Status: DC | PRN

## 2022-10-26 MED ORDER — SODIUM CHLORIDE 0.9% FLUSH
3.0000 mL | Freq: Two times a day (BID) | INTRAVENOUS | Status: DC
  Administered 2022-10-26 – 2022-10-27 (×4): 3 mL via INTRAVENOUS

## 2022-10-26 MED ORDER — LISINOPRIL 10 MG PO TABS
10.0000 mg | ORAL_TABLET | Freq: Every day | ORAL | Status: DC
  Administered 2022-10-26: 10 mg via ORAL
  Filled 2022-10-26: qty 1

## 2022-10-26 MED ORDER — ACETAMINOPHEN 650 MG RE SUPP: 650.0000 mg | Freq: Four times a day (QID) | RECTAL | Status: DC | PRN

## 2022-10-26 MED ORDER — ENOXAPARIN SODIUM 40 MG/0.4ML IJ SOSY
40.0000 mg | PREFILLED_SYRINGE | INTRAMUSCULAR | Status: DC
  Administered 2022-10-26 – 2022-10-27 (×2): 40 mg via SUBCUTANEOUS
  Filled 2022-10-26 (×2): qty 0.4

## 2022-10-26 MED ORDER — ONDANSETRON HCL 4 MG PO TABS: 4.0000 mg | ORAL_TABLET | Freq: Four times a day (QID) | ORAL | Status: DC | PRN

## 2022-10-26 MED ORDER — ONDANSETRON HCL 4 MG/2ML IJ SOLN
4.0000 mg | Freq: Four times a day (QID) | INTRAMUSCULAR | Status: DC | PRN
  Administered 2022-10-26: 4 mg via INTRAVENOUS
  Filled 2022-10-26: qty 2

## 2022-10-26 MED ORDER — LABETALOL HCL 5 MG/ML IV SOLN
10.0000 mg | INTRAVENOUS | Status: DC | PRN
  Administered 2022-10-26 – 2022-10-27 (×6): 10 mg via INTRAVENOUS
  Filled 2022-10-26 (×5): qty 4

## 2022-10-26 MED ORDER — AMLODIPINE BESYLATE 10 MG PO TABS
10.0000 mg | ORAL_TABLET | Freq: Every day | ORAL | Status: DC
  Administered 2022-10-26 – 2022-10-27 (×2): 10 mg via ORAL
  Filled 2022-10-26 (×2): qty 1

## 2022-10-26 MED ORDER — CHLORHEXIDINE GLUCONATE CLOTH 2 % EX PADS
6.0000 | MEDICATED_PAD | Freq: Every day | CUTANEOUS | Status: DC
  Administered 2022-10-26: 6 via TOPICAL

## 2022-10-26 MED ORDER — ACETAMINOPHEN 500 MG PO TABS
1000.0000 mg | ORAL_TABLET | Freq: Four times a day (QID) | ORAL | Status: DC | PRN
  Administered 2022-10-26 – 2022-10-27 (×2): 1000 mg via ORAL
  Filled 2022-10-26 (×2): qty 2

## 2022-10-26 MED ORDER — IOHEXOL 350 MG/ML SOLN
75.0000 mL | Freq: Once | INTRAVENOUS | Status: AC | PRN
  Administered 2022-10-26: 75 mL via INTRAVENOUS

## 2022-10-26 MED ORDER — HYDROCHLOROTHIAZIDE 12.5 MG PO TABS
12.5000 mg | ORAL_TABLET | Freq: Every day | ORAL | Status: DC
  Administered 2022-10-27: 12.5 mg via ORAL
  Filled 2022-10-26: qty 1

## 2022-10-26 MED ORDER — CLONIDINE HCL 0.1 MG PO TABS
0.2000 mg | ORAL_TABLET | Freq: Once | ORAL | Status: AC
  Administered 2022-10-26: 0.2 mg via ORAL
  Filled 2022-10-26: qty 2

## 2022-10-26 MED ORDER — HYDRALAZINE HCL 20 MG/ML IJ SOLN
20.0000 mg | INTRAMUSCULAR | Status: DC | PRN
  Administered 2022-10-26 – 2022-10-27 (×4): 20 mg via INTRAVENOUS
  Filled 2022-10-26 (×5): qty 1

## 2022-10-26 NOTE — ED Provider Notes (Incomplete)
[place] EMERGENCY DEPARTMENT AT [facility] Provider Note   CSN: [id number] Arrival date & time: 10/25/22  1853     History {Add pertinent medical, surgical, social history, OB history to HPI:1} Chief Complaint  Patient presents with  . Headache  . Emesis  . Neck Pain    Victor Dominguez is a 47 y.o. male.  HPI    47 year old male comes in with chief complaint of headache, neck pain.  Patient has history of chronic headaches.  He also has history of hypertension, but he does not take any medications.  He does not have a PCP.  Patient states that he has chronic headaches.  He suffers with headaches 4-5 times a week.  The headaches are generalized.  He normally takes Excedrin for the headaches.  Over the last 4 weeks or so he has also been having neck pain.  Neck pain is on both sides, it comes with the headaches and resolves with it.  This current episode of headache started last night.  This morning after shower, he felt that the headache was severe.  He took Excedrin and there was no relief to his symptoms  And so he decided to come to the ER.  Headaches are generalized, described as throbbing pain and he has associated blurry vision and also has felt nauseous.  Patient denies any associated double vision, slurred speech, one-sided weakness, numbness, difficulty in swallowing.  Patient's wife is at the bedside, provides meaningful history as well.  Patient denies any substance use disorder.   Home Medications Prior to Admission medications   Medication Sig Start Date End Date Taking? Authorizing Provider  aspirin-acetaminophen-caffeine (EXCEDRIN MIGRAINE) [phone] MG tablet Take 1 tablet by mouth every 6 (six) hours as needed for headache.   Yes [provider]  amLODipine (NORVASC) 5 MG tablet Take 1 tablet (5 mg total) by mouth daily. Patient not taking: Reported on 10/25/2022 08/28/20   [name], MD  amoxicillin (AMOXIL) 500 MG capsule Take  2 capsules (1,000 mg total) by mouth 3 (three) times daily. Patient not taking: Reported on 10/25/2022 08/28/20   [name], MD  cyclobenzaprine (FLEXERIL) 10 MG tablet Take 1 tablet (10 mg total) by mouth 2 (two) times daily as needed for muscle spasms. Patient not taking: Reported on 10/25/2022 09/25/18   Dahlia Byes A, NP  doxycycline (VIBRAMYCIN) 100 MG capsule Take 1 capsule (100 mg total) by mouth 2 (two) times daily. One po bid x 7 days Patient not taking: Reported on 10/25/2022 08/28/20   [name], MD  hydrochlorothiazide (HYDRODIURIL) 25 MG tablet Take 1 tablet (25 mg total) by mouth daily. 12/31/18 04/17/20  Muthersbaugh, Dahlia Client, PA-C      Allergies    Patient has no known allergies.    Review of Systems   Review of Systems  All other systems reviewed and are negative.   Physical Exam Updated Vital Signs BP (!) 205/126   Pulse 75   Temp 98.1 F (36.7 C) (Oral)   Resp 19   Wt 107.5 kg   SpO2 98%   BMI 30.43 kg/m  Physical Exam Vitals and nursing note reviewed.  Constitutional:      Appearance: He is well-developed. He is not toxic-appearing or diaphoretic.  HENT:     Head: Atraumatic.  Eyes:     General: No visual field deficit. Neck:     Comments: No clear meningismus Cardiovascular:     Rate and Rhythm: Normal rate.  Pulmonary:  Effort: Pulmonary effort is normal.  Musculoskeletal:     Cervical back: Neck supple.  Skin:    General: Skin is warm.  Neurological:     Mental Status: He is oriented to person, place, and time.     GCS: GCS eye subscore is 4. GCS verbal subscore is 5. GCS motor subscore is 6.     Cranial Nerves: No cranial nerve deficit, dysarthria or facial asymmetry.     Sensory: No sensory deficit.     Motor: No weakness.     Coordination: Coordination normal.     ED Results / Procedures / Treatments   Labs (all labs ordered are listed, but only abnormal results are displayed) Labs Reviewed  COMPREHENSIVE METABOLIC PANEL -  Abnormal; Notable for the following components:      Result Value   Glucose, Bld 113 (*)    All other components within normal limits  URINALYSIS, ROUTINE W REFLEX MICROSCOPIC - Abnormal; Notable for the following components:   Protein, ur 30 (*)    All other components within normal limits  LIPASE, BLOOD  CBC    EKG EKG Interpretation Date/Time:  Tuesday October 25 2022 19:44:30 EDT Ventricular Rate:  70 PR Interval:  159 QRS Duration:  88 QT Interval:  426 QTC Calculation: 460 R Axis:   -19  Text Interpretation: Sinus rhythm Probable left atrial enlargement Probable left ventricular hypertrophy ST elev, probable normal early repol pattern No acute changes lateral TWI is new new ST elevation is likely early repo Confirmed by Derwood Kaplan [phone]) on 10/25/2022 9:58:11 PM  Radiology CT Head Wo Contrast  Result Date: 10/25/2022 CLINICAL DATA:  Headache 3rd rib focal EXAM: CT HEAD WITHOUT CONTRAST TECHNIQUE: Contiguous axial images were obtained from the base of the skull through the vertex without intravenous contrast. RADIATION DOSE REDUCTION: This exam was performed according to the departmental dose-optimization program which includes automated exposure control, adjustment of the mA and/or kV according to patient size and/or use of iterative reconstruction technique. COMPARISON:  None Available. FINDINGS: Brain: No evidence of acute infarction, hemorrhage, hydrocephalus, extra-axial collection or mass lesion/mass effect. Vascular: No hyperdense vessel or unexpected calcification. Skull: Normal. Negative for fracture or focal lesion. Sinuses/Orbits: The visualized paranasal sinuses are essentially clear. The mastoid air cells are unopacified. Radiopaque foreign body along the left superior orbital ridge. Other: None. IMPRESSION: Normal head CT. Radiopaque foreign body along the left superior orbital ridge. Electronically Signed   By: Charline Bills M.D.   On: 10/25/2022 20:28     Procedures Procedures  {Document cardiac monitor, telemetry assessment procedure when appropriate:1}  Medications Ordered in ED Medications  labetalol (NORMODYNE) 5 MG/ML injection (  Not Given 10/26/22 0001)  hydrALAZINE (APRESOLINE) injection 10 mg (has no administration in time range)  cloNIDine (CATAPRES) tablet 0.2 mg (has no administration in time range)  diphenhydrAMINE (BENADRYL) injection 25 mg ( Intravenous Canceled Entry 10/25/22 2358)  prochlorperazine (COMPAZINE) injection 10 mg (10 mg Intravenous Not Given 10/25/22 2359)  labetalol (NORMODYNE) injection 20 mg (20 mg Intravenous Given 10/25/22 2209)  labetalol (NORMODYNE) injection 40 mg (40 mg Intravenous Given 10/25/22 2244)    And  labetalol (NORMODYNE) injection 80 mg (80 mg Intravenous Given 10/25/22 2330)    ED Course/ Medical Decision Making/ A&P   {   Click here for ABCD2, HEART and other calculatorsREFRESH Note before signing :1}  Medical Decision Making Amount and/or Complexity of Data Reviewed Labs: ordered.  Risk Prescription drug management.   47 year old male comes in with chief complaint of headache, neck pain and is found to be hypertensive.  Patient has known history of uncontrolled hypertension and chronic headaches.  He does not have a PCP and has not seen anyone for his headaches or blood pressure.  Patient noted to have MAP of 160.   Final Clinical Impression(s) / ED Diagnoses Final diagnoses:  Malignant hypertension  Malignant hypertensive urgency    Rx / DC Orders ED Discharge Orders          Ordered    AMB Referral to Pharmacy Medication Management        10/25/22 2356

## 2022-10-26 NOTE — H&P (Signed)
History and Physical    DEMETRICUS Dominguez ZHY:[id number] DOB: May 08, 1975 DOA: 10/25/2022  PCP: Victor Dominguez  Patient coming from: Home  I have personally briefly reviewed patient's old medical records in [facility]  Chief Complaint: Headaches  HPI: Victor Dominguez is a 47 y.o. male with medical history significant for uncontrolled hypertension, headaches, tobacco use who presented to the ED for evaluation of recurrent persistent headache associated with nausea and vomiting.  Patient reports a history of frequent headaches, 4-5 times a week.  Normally improves after he takes Tylenol and/or Excedrin.  Recently headaches have been more frequent and persistent despite taking Tylenol/Excedrin.  He has developed pain on both lateral sides of his neck.  This occurs during his headaches and resolves when headache improves.  This morning after he took a shower headache was severe.  Headache described as generalized throbbing sensation.  He has had some nausea and 1 episode of emesis today.  He has had some blurry vision.  He denies change in speech, new weakness in his extremities, change in sensation, chest pain, dyspnea, or peripheral edema.  Patient reports smoking about half pack cigarettes Dominguez day.  He denies any alcohol use.  He reports occasional marijuana use.  He denies any cocaine or injection drug use.  He does not take any prescription medications.  He does not have a PCP.  ED Course  Labs/Imaging on admission: I have personally reviewed following labs and imaging studies.  Initial vitals showed BP 227/158, pulse 75, RR 14, temp 97.8 F, SpO2 98% on room air.  Labs show WBC 6.5, hemoglobin 16.4, platelets 177,000, sodium 136, potassium 3.5, bicarb 23, BUN 10, creatinine 0.6, serum glucose 113, FTs within normal limits, lipase 23, urinalysis negative for UTI.  CT head without contrast is a normal head CT.  Radiopaque foreign body along the left superior orbital ridge  noted.  CTA head/neck negative for intracranial LVO or significant stenosis.  No hemodynamically significant stenosis in the neck.  1.9 cm hypoenhancing nodule in the left thyroid lobe noted.  Patient was given IV labetalol 20 mg >40 mg >80 mg, IV Compazine and Benadryl, IV hydralazine 10 mg, oral clonidine 0.2 mg.  The hospitalist service was consulted to admit for further evaluation and management.  Review of Systems: All systems reviewed and are negative except as documented in history of present illness above.   Past Medical History:  Diagnosis Date   Hypertension     No past surgical history on file.  Social History:  reports that he has been smoking cigarettes. He has never used smokeless tobacco. He reports current alcohol use. No history on file for drug use.  No Known Allergies  No family history on file.   Prior to Admission medications   Medication Sig Start Date End Date Taking? Authorizing Provider  aspirin-acetaminophen-caffeine (EXCEDRIN MIGRAINE) [phone] MG tablet Take 1 tablet by mouth every 6 (six) hours as needed for headache.   Yes [provider]  amLODipine (NORVASC) 5 MG tablet Take 1 tablet (5 mg total) by mouth daily. Patient not taking: Reported on 10/25/2022 08/28/20   [name], MD  amoxicillin (AMOXIL) 500 MG capsule Take 2 capsules (1,000 mg total) by mouth 3 (three) times daily. Patient not taking: Reported on 10/25/2022 08/28/20   [name], MD  cyclobenzaprine (FLEXERIL) 10 MG tablet Take 1 tablet (10 mg total) by mouth 2 (two) times daily as needed for muscle spasms. Patient not taking: Reported on 10/25/2022  09/25/18   Dahlia Byes A, NP  doxycycline (VIBRAMYCIN) 100 MG capsule Take 1 capsule (100 mg total) by mouth 2 (two) times daily. One po bid x 7 days Patient not taking: Reported on 10/25/2022 08/28/20   [name], MD  hydrochlorothiazide (HYDRODIURIL) 25 MG tablet Take 1 tablet (25 mg total) by mouth daily. 12/31/18  04/17/20  Muthersbaugh, Dahlia Client, PA-C    Physical Exam: Vitals:   10/25/22 2353 10/26/22 0012 10/26/22 0045 10/26/22 0100  BP:  (!) 220/127    Pulse:   77 72  Resp:   18 20  Temp: 98.1 F (36.7 C)     TempSrc: Oral     SpO2:   99% 99%  Weight:       Constitutional: Resting in bed, appears fatigued.  NAD, calm. Eyes: EOMI, lids and conjunctivae normal ENMT: Mucous membranes are moist. Posterior pharynx clear of any exudate or lesions.Normal dentition.  Neck: normal, supple, no masses. Respiratory: clear to auscultation bilaterally, no wheezing, no crackles. Normal respiratory effort. No accessory muscle use.  Cardiovascular: Regular rate and rhythm, no murmurs / rubs / gallops. No extremity edema. 2+ pedal pulses. Abdomen: no tenderness, no masses palpated.  Musculoskeletal: no clubbing / cyanosis. No joint deformity upper and lower extremities. Good ROM, no contractures. Normal muscle tone.  Skin: no rashes, lesions, ulcers. No induration Neurologic: CN 2-12 grossly intact. Sensation intact. Strength 5/5 in all 4.  Psychiatric: Alert and oriented x 3. Normal mood.   EKG: Personally reviewed. Sinus rhythm, rate 70, LVH.  Rate is slower when compared to previous.  Assessment/Plan Principal Problem:   Malignant hypertension Active Problems:   Thyroid nodule   Tobacco use   Victor Dominguez is a 47 y.o. male with medical history significant for uncontrolled hypertension, headaches, tobacco use who is admitted with malignant hypertension.  Assessment and Plan: Malignant hypertension: Severely uncontrolled hypertension.  Only mild improvement with multiple doses of IV labetalol.  Now receiving IV hydralazine and oral clonidine.  No focal deficits, renal function stable, CT head and CTA head/neck reassuring. -Admit to stepdown unit -Alternate IV hydralazine and IV labetalol prn, goal SBP closer to 180 -If persistently elevated may need to place on Cardene drip and escalate to ICU  level of care  Tobacco use: Patient reports smoking half pack Dominguez day.  He states he will quit.  He declines nicotine patch.  Thyroid nodule: A 1.9 cm hypoenhancing nodule in the left thyroid lobe was seen on CT imaging.  Nonemergent ultrasound of thyroid recommended.  Will obtain TSH.   DVT prophylaxis: enoxaparin (LOVENOX) injection 40 mg Start: 10/26/22 0115 Code Status: Full code Family Communication: Spouse at bedside Disposition Plan: From home and discharge to home pending clinical progress Consults called: None Severity of Illness: The appropriate patient status for this patient is INPATIENT. Inpatient status is judged to be reasonable and necessary in order to provide the required intensity of service to ensure the patient's safety. The patient's presenting symptoms, physical exam findings, and initial radiographic and laboratory data in the context of their chronic comorbidities is felt to place them at high risk for further clinical deterioration. Furthermore, it is not anticipated that the patient will be medically stable for discharge from the hospital within 2 midnights of admission.   * I certify that at the point of admission it is my clinical judgment that the patient will require inpatient hospital care spanning beyond 2 midnights from the point of admission due to high intensity of  service, high risk for further deterioration and high frequency of surveillance required.Darreld Mclean MD [facility]  If 7PM-7AM, please contact night-coverage [email or web address]  10/26/2022, 1:17 AM

## 2022-10-26 NOTE — Hospital Course (Signed)
Victor Dominguez is a 47 y.o. male with medical history significant for uncontrolled hypertension, headaches, tobacco use who is admitted with malignant hypertension.

## 2022-10-26 NOTE — Plan of Care (Signed)

## 2022-10-26 NOTE — ED Notes (Signed)
ED TO INPATIENT HANDOFF REPORT  ED Nurse Name and Phone #: Deon Pilling [phone]  S Name/Age/Gender Victor Dominguez 47 y.o. male Room/Bed: WA21/WA21  Code Status   Code Status: Full Code  Home/SNF/Other Home Patient oriented to: self, place, time, and situation Is this baseline? Yes   Triage Complete: Triage complete  Chief Complaint Malignant hypertension [I10]  Triage Note Patient presents due to headaches for a few days, light sensitivity, blurry vision, back pain and vomiting. He is unsure as to the cause. He does not have a history of migraines.    Allergies No Known Allergies  Level of Care/Admitting Diagnosis ED Disposition     ED Disposition  Admit   Condition  --   Comment  Hospital [facility]: [facility] [[id number]]  Level of Care: Stepdown [14]  Admit to SDU based on following criteria: Hemodynamic compromise or significant risk of instability:  Patient requiring short term acute titration and management of vasoactive drips, and invasive monitoring (i.e., CVP and Arterial line).  May admit patient to [facility] or [facility] if equivalent level of care is available:: No  Covid Evaluation: Asymptomatic - no recent exposure (last 10 days) testing not required  Diagnosis: Malignant hypertension [573220]  Admitting Physician: Charlsie Quest [[id number]]  Attending Physician: Charlsie Quest [[id number]]  Certification:: I certify this patient will need inpatient services for at least 2 midnights  Expected Medical Readiness: 10/27/2022          B Medical/Surgery History Past Medical History:  Diagnosis Date   Hypertension    No past surgical history on file.   A IV Location/Drains/Wounds Patient Lines/Drains/Airways Status     Active Line/Drains/Airways     Name Placement date Placement time Site Days   Peripheral IV 12/31/18 Left Forearm 12/31/18  0005  Forearm  1395   Peripheral IV 10/25/22 20 G Left Antecubital 10/25/22  2112  Antecubital   1            Intake/Output Last 24 hours No intake or output data in the 24 hours ending 10/26/22 0119  Labs/Imaging Results for orders placed or performed during the hospital encounter of 10/25/22 (from the past 48 hour(s))  Urinalysis, Routine w reflex microscopic -Urine, Clean Catch     Status: Abnormal   Collection Time: 10/25/22  8:38 PM  Result Value Ref Range   Color, Urine YELLOW YELLOW   APPearance CLEAR CLEAR   Specific Gravity, Urine 1.012 1.005 - 1.030   pH 7.0 5.0 - 8.0   Glucose, UA NEGATIVE NEGATIVE mg/dL   Hgb urine dipstick NEGATIVE NEGATIVE   Bilirubin Urine NEGATIVE NEGATIVE   Ketones, ur NEGATIVE NEGATIVE mg/dL   Protein, ur 30 (A) NEGATIVE mg/dL   Nitrite NEGATIVE NEGATIVE   Leukocytes,Ua NEGATIVE NEGATIVE   RBC / HPF 0-5 0 - 5 RBC/hpf   WBC, UA 0-5 0 - 5 WBC/hpf   Bacteria, UA NONE SEEN NONE SEEN   Squamous Epithelial / HPF 0-5 0 - 5 /HPF   Mucus PRESENT     Comment: Performed at [facility], [address] [place], [place] [address]  Lipase, blood     Status: None   Collection Time: 10/25/22  9:05 PM  Result Value Ref Range   Lipase 23 11 - 51 U/L    Comment: Performed at [facility], [address], [place], [place] [address]  Comprehensive metabolic panel     Status: Abnormal   Collection Time: 10/25/22  9:05 PM  Result Value Ref Range   Sodium 136 135 - 145 mmol/L   Potassium 3.5 3.5 - 5.1 mmol/L   Chloride 104 98 - 111 mmol/L   CO2 23 22 - 32 mmol/L   Glucose, Bld 113 (H) 70 - 99 mg/dL    Comment: Glucose reference range applies only to samples taken after fasting for at least 8 hours.   BUN 10 6 - 20 mg/dL   Creatinine, Ser 0.10 0.61 - 1.24 mg/dL   Calcium 8.9 8.9 - 27.2 mg/dL   Total Protein 8.0 6.5 - 8.1 g/dL   Albumin 4.8 3.5 - 5.0 g/dL   AST 22 15 - 41 U/L   ALT 38 0 - 44 U/L   Alkaline Phosphatase 62 38 - 126 U/L   Total Bilirubin 1.0 0.3 - 1.2 mg/dL   GFR, Estimated >53 >66 mL/min     Comment: (NOTE) Calculated using the CKD-EPI Creatinine Equation (2021)    Anion gap 9 5 - 15    Comment: Performed at [facility], [address] [place], [place] [address]  CBC     Status: None   Collection Time: 10/25/22  9:05 PM  Result Value Ref Range   WBC 6.5 4.0 - 10.5 K/uL   RBC 5.35 4.22 - 5.81 MIL/uL   Hemoglobin 16.4 13.0 - 17.0 g/dL   HCT 74.2 59.5 - 63.8 %   MCV 86.4 80.0 - 100.0 fL   MCH 30.7 26.0 - 34.0 pg   MCHC 35.5 30.0 - 36.0 g/dL   RDW 75.6 43.3 - 29.5 %   Platelets 177 150 - 400 K/uL   nRBC 0.0 0.0 - 0.2 %    Comment: Performed at [facility], [address] [place], [place] [address]   CT ANGIO HEAD NECK W WO CM  Result Date: 10/26/2022 CLINICAL DATA:  Headache and neck pain EXAM: CT ANGIOGRAPHY HEAD AND NECK WITH AND WITHOUT CONTRAST TECHNIQUE: Multidetector CT imaging of the head and neck was performed using the standard protocol during bolus administration of intravenous contrast. Multiplanar CT image reconstructions and MIPs were obtained to evaluate the vascular anatomy. Carotid stenosis measurements (when applicable) are obtained utilizing NASCET criteria, using the distal internal carotid diameter as the denominator. RADIATION DOSE REDUCTION: This exam was performed according to the departmental dose-optimization program which includes automated exposure control, adjustment of the mA and/or kV according to patient size and/or use of iterative reconstruction technique. CONTRAST:  75mL OMNIPAQUE IOHEXOL 350 MG/ML SOLN COMPARISON:  No prior CTA available, correlation is made with 10/25/2022 CT head FINDINGS: CT HEAD FINDINGS For noncontrast findings, please see 10/25/2022 CT head. CTA NECK FINDINGS Aortic arch: Two-vessel arch with a common origin of the brachiocephalic and left common carotid arteries. Imaged portion shows no evidence of aneurysm or dissection. No significant stenosis of the major arch vessel origins. Right  carotid system: No evidence of dissection, occlusion, or hemodynamically significant stenosis (greater than 50%). Left carotid system: No evidence of dissection, occlusion, or hemodynamically significant stenosis (greater than 50%). Vertebral arteries: Left dominant system. No evidence of dissection, occlusion, or hemodynamically significant stenosis (greater than 50%). Skeleton: No acute osseous abnormality. Other neck: 1.9 cm hypoenhancing nodule in the left thyroid lobe. Additional smaller hypoenhancing nodule in the right thyroid lobe. Upper chest: No focal pulmonary opacity or pleural effusion. Review of the MIP images confirms the above findings CTA HEAD FINDINGS Anterior circulation: Both internal carotid arteries are patent to the termini,  without significant stenosis. A1 segments patent. Normal anterior communicating artery. Anterior cerebral arteries are patent to their distal aspects without significant stenosis. No M1 stenosis or occlusion. MCA branches perfused to their distal aspects without significant stenosis. Posterior circulation: Vertebral arteries patent to the vertebrobasilar junction without significant stenosis. The right vertebral artery primarily supplies the right PICA. Posterior inferior cerebellar arteries patent proximally. Basilar patent to its distal aspect without significant stenosis. Superior cerebellar arteries patent proximally. Patent right P1. Aplastic left P1. Fetal origin of the left PCA from the left posterior communicating artery. PCAs perfused to their distal aspects without significant stenosis. The left posterior communicating artery is not seen. Venous sinuses: As permitted by contrast timing, patent. Anatomic variants: Fetal origin of the left PCA. No evidence of aneurysm or vascular malformation. Review of the MIP images confirms the above findings IMPRESSION: 1. No intracranial large vessel occlusion or significant stenosis. 2. No hemodynamically significant stenosis  in the neck. 3. 1.9 cm hypoenhancing nodule in the left thyroid lobe. If this has not previously been evaluated, a non-emergent ultrasound of the thyroid is recommended. (Reference: J Am Coll Radiol. 2015 Feb;12(2): 143-50) Electronically Signed   By: Wiliam Ke M.D.   On: 10/26/2022 01:03   CT Head Wo Contrast  Result Date: 10/25/2022 CLINICAL DATA:  Headache 3rd rib focal EXAM: CT HEAD WITHOUT CONTRAST TECHNIQUE: Contiguous axial images were obtained from the base of the skull through the vertex without intravenous contrast. RADIATION DOSE REDUCTION: This exam was performed according to the departmental dose-optimization program which includes automated exposure control, adjustment of the mA and/or kV according to patient size and/or use of iterative reconstruction technique. COMPARISON:  None Available. FINDINGS: Brain: No evidence of acute infarction, hemorrhage, hydrocephalus, extra-axial collection or mass lesion/mass effect. Vascular: No hyperdense vessel or unexpected calcification. Skull: Normal. Negative for fracture or focal lesion. Sinuses/Orbits: The visualized paranasal sinuses are essentially clear. The mastoid air cells are unopacified. Radiopaque foreign body along the left superior orbital ridge. Other: None. IMPRESSION: Normal head CT. Radiopaque foreign body along the left superior orbital ridge. Electronically Signed   By: Charline Bills M.D.   On: 10/25/2022 20:28    Pending Labs Unresulted Labs (From admission, onward)     Start     Ordered   10/26/22 0500  HIV Antibody (routine testing w rflx)  (HIV Antibody (Routine testing w reflex) panel)  Tomorrow morning,   R        10/26/22 0114   10/26/22 0500  Basic metabolic panel  Tomorrow morning,   R        10/26/22 0114   10/26/22 0500  CBC  Tomorrow morning,   R        10/26/22 0114   10/26/22 0114  Rapid urine drug screen (hospital performed)  Add-on,   AD        10/26/22 0113   10/26/22 0114  TSH  Once,   R         10/26/22 0113            Vitals/Pain Today's Vitals   10/25/22 2353 10/26/22 0012 10/26/22 0045 10/26/22 0100  BP:  (!) 220/127    Pulse:   77 72  Resp:   18 20  Temp: 98.1 F (36.7 C)     TempSrc: Oral     SpO2:   99% 99%  Weight:      PainSc:        Isolation Precautions No active isolations  Medications Medications  labetalol (NORMODYNE) 5 MG/ML injection (  Not Given 10/26/22 0001)  hydrALAZINE (APRESOLINE) injection 20 mg (has no administration in time range)  labetalol (NORMODYNE) injection 10 mg (has no administration in time range)  enoxaparin (LOVENOX) injection 40 mg (has no administration in time range)  sodium chloride flush (NS) 0.9 % injection 3 mL (3 mLs Intravenous Given 10/26/22 0119)  acetaminophen (TYLENOL) tablet 1,000 mg (has no administration in time range)    Or  acetaminophen (TYLENOL) suppository 650 mg (has no administration in time range)  ondansetron (ZOFRAN) tablet 4 mg (has no administration in time range)    Or  ondansetron (ZOFRAN) injection 4 mg (has no administration in time range)  senna-docusate (Senokot-S) tablet 1 tablet (has no administration in time range)  diphenhydrAMINE (BENADRYL) injection 25 mg ( Intravenous Canceled Entry 10/25/22 2358)  prochlorperazine (COMPAZINE) injection 10 mg (10 mg Intravenous Not Given 10/25/22 2359)  labetalol (NORMODYNE) injection 20 mg (20 mg Intravenous Given 10/25/22 2209)  labetalol (NORMODYNE) injection 40 mg (40 mg Intravenous Given 10/25/22 2244)    And  labetalol (NORMODYNE) injection 80 mg (80 mg Intravenous Given 10/25/22 2330)  hydrALAZINE (APRESOLINE) injection 10 mg (10 mg Intravenous Given 10/26/22 0012)  cloNIDine (CATAPRES) tablet 0.2 mg (0.2 mg Oral Given 10/26/22 0012)  iohexol (OMNIPAQUE) 350 MG/ML injection 75 mL (75 mLs Intravenous Contrast Given 10/26/22 0031)    Mobility walks     Focused Assessments    R Recommendations: See Admitting Provider Note  Report given to:    Additional Notes:

## 2022-10-26 NOTE — Progress Notes (Signed)
Patient admitted after midnight, please see H&P.  Here with known history of untreated HTN.  Was given PRNs by admitting MD.  Will start prior BP med and an additional medication to see how he responds.  Will need PCP and close outpatient titration of BP meds.  Headache resolved.  Victor Canary DO

## 2022-10-26 NOTE — TOC Initial Note (Signed)
Transition of Care [facility]) - Initial/Assessment Note    Patient Details  Name: Victor Dominguez MRN: [id number] Date of Birth: 01-09-1976  [facility] CM/SW Contact:    Howell Rucks, RN Phone Number: 10/26/2022, 3:08 PM  Clinical Narrative: Met wit hpt at bedside to introduce role of TOC/NCM and review for dc planning. TOC consult for PCP needs.  Pt reports he does not have an established PCP, agreeable to NCM scheduled hospital follow up appt at one of [facility], pt reports he has a pharmacy, no current home care services or home DME, pt reports he feels safe returning home with support from his spouse, spouse will provide transportation at discharge. TOC will continue to follow.   -3:10pm Hospital follow up appt scheduled at [facility], Wednesday, November 02, 2022 at 9:20am, added to AVS.                     Expected Discharge Plan: Home/Self Care Barriers to Discharge: Continued Medical Work up   Patient Goals and CMS Choice Patient states their goals for this hospitalization and ongoing recovery are:: return home with spouse          Expected Discharge Plan and Services       Living arrangements for the past 2 months: Single Family Home                                      Prior Living Arrangements/Services Living arrangements for the past 2 months: Single Family Home Lives with:: Spouse Patient language and need for interpreter reviewed:: Yes Do you feel safe going back to the place where you live?: Yes      Need for Family Participation in Patient Care: Yes (Comment) Care giver support system in place?: Yes (comment)   Criminal Activity/Legal Involvement Pertinent to Current Situation/Hospitalization: No - Comment as needed  Activities of Daily Living      Permission Sought/Granted Permission sought to share information with : Case Manager Permission granted to share information with : Yes,  Verbal Permission Granted  Share Information with NAME: Fannie Knee, RN           Emotional Assessment Appearance:: Appears older than stated age Attitude/Demeanor/Rapport: Gracious Affect (typically observed): Accepting Orientation: : Oriented to Self, Oriented to Place, Oriented to  Time, Oriented to Situation Alcohol / Substance Use: Not Applicable Psych Involvement: No (comment)  Admission diagnosis:  Malignant hypertension [I10] Malignant hypertensive urgency [I16.0] Patient Active Problem List   Diagnosis Date Noted   Malignant hypertension 10/26/2022   Thyroid nodule 10/26/2022   Tobacco use 10/26/2022   Nephrolithiasis 10/05/2012   PCP:  Patient, No Pcp Per Pharmacy:   [id number] - [place], [place] - [address] AT [facility] [address] [place] [address] Phone: [phone] Fax: [phone]  [id number] - [place] - [address] [place] [address] Phone: [phone] Fax: [phone]  [id number] [place], [place] - [address] AT [facility] [address] [place] [address] Phone: [phone] Fax: [phone]     Social Determinants of Health (SDOH) Social History: SDOH Screenings   Tobacco Use: High Risk (10/25/2022)   SDOH Interventions:     Readmission Risk Interventions    10/26/2022  3:07 PM  Readmission Risk Prevention Plan  Post Dischage Appt Complete  Medication Screening Complete  Transportation Screening Complete

## 2022-10-27 ENCOUNTER — Other Ambulatory Visit (HOSPITAL_COMMUNITY): Payer: Self-pay

## 2022-10-27 ENCOUNTER — Inpatient Hospital Stay (HOSPITAL_COMMUNITY): Payer: 59

## 2022-10-27 DIAGNOSIS — I10 Essential (primary) hypertension: Secondary | ICD-10-CM

## 2022-10-27 LAB — BASIC METABOLIC PANEL
Anion gap: 9 (ref 5–15)
BUN: 13 mg/dL (ref 6–20)
CO2: 27 mmol/L (ref 22–32)
Calcium: 8.7 mg/dL — ABNORMAL LOW (ref 8.9–10.3)
Chloride: 101 mmol/L (ref 98–111)
Creatinine, Ser: 1.16 mg/dL (ref 0.61–1.24)
GFR, Estimated: >60 mL/min (ref >60)
Glucose, Bld: 110 mg/dL — ABNORMAL HIGH (ref 70–99)
Potassium: 3.5 mmol/L (ref 3.5–5.1)
Sodium: 137 mmol/L (ref 135–145)

## 2022-10-27 MED ORDER — IRBESARTAN 75 MG PO TABS
37.5000 mg | ORAL_TABLET | Freq: Every day | ORAL | Status: DC
  Administered 2022-10-27: 37.5 mg via ORAL
  Filled 2022-10-27: qty 0.5

## 2022-10-27 MED ORDER — HYDROCHLOROTHIAZIDE 25 MG PO TABS: 25.0000 mg | ORAL_TABLET | Freq: Every day | ORAL | Status: DC

## 2022-10-27 MED ORDER — VALSARTAN 40 MG PO TABS
40.0000 mg | ORAL_TABLET | Freq: Every day | ORAL | 0 refills | Status: DC
Start: 2022-10-27 — End: 2022-11-02
  Filled 2022-10-27: qty 30, 30d supply, fill #0

## 2022-10-27 MED ORDER — AMLODIPINE BESYLATE 10 MG PO TABS
10.0000 mg | ORAL_TABLET | Freq: Every day | ORAL | 0 refills | Status: DC
Start: 2022-10-28 — End: 2022-11-02
  Filled 2022-10-27: qty 30, 30d supply, fill #0

## 2022-10-27 MED ORDER — HYDROCHLOROTHIAZIDE 25 MG PO TABS
25.0000 mg | ORAL_TABLET | Freq: Every day | ORAL | 0 refills | Status: DC
Start: 2022-10-28 — End: 2022-11-02
  Filled 2022-10-27: qty 30, 30d supply, fill #0

## 2022-10-27 NOTE — Plan of Care (Signed)
Education done on hypertension, plan of care and goals, time given for questions and answers, patient handbook/guide at bedside.  Problem: Education: Goal: Knowledge of General Education information will improve Description: Including pain rating scale, medication(s)/side effects and non-pharmacologic comfort measures Outcome: Progressing   Problem: Health Behavior/Discharge Planning: Goal: Ability to manage health-related needs will improve Outcome: Progressing   Problem: Clinical Measurements: Goal: Ability to maintain clinical measurements within normal limits will improve Outcome: Progressing Goal: Will remain free from infection Outcome: Progressing Goal: Diagnostic test results will improve Outcome: Progressing Goal: Respiratory complications will improve Outcome: Progressing Goal: Cardiovascular complication will be avoided Outcome: Progressing   Problem: Activity: Goal: Risk for activity intolerance will decrease Outcome: Progressing   Problem: Nutrition: Goal: Adequate nutrition will be maintained Outcome: Progressing   Problem: Coping: Goal: Level of anxiety will decrease Outcome: Progressing   Problem: Elimination: Goal: Will not experience complications related to bowel motility Outcome: Progressing Goal: Will not experience complications related to urinary retention Outcome: Progressing   Problem: Pain Managment: Goal: General experience of comfort will improve Outcome: Progressing   Problem: Safety: Goal: Ability to remain free from injury will improve Outcome: Progressing   Problem: Skin Integrity: Goal: Risk for impaired skin integrity will decrease Outcome: Progressing

## 2022-10-27 NOTE — Discharge Summary (Signed)
Physician Discharge Summary  Victor Dominguez GMW:[id number] DOB: 1975/11/02 DOA: 10/25/2022  PCP: Patient, No Pcp Per  Admit date: 10/25/2022 Discharge date: 10/27/2022  Admitted From: home Discharge disposition: home   Recommendations for Outpatient Follow-Up:   PCP to continue to adjust BP meds Needs outpatient thyroid U/S BMP 1 week   Discharge Diagnosis:   Principal Problem:   Malignant hypertension Active Problems:   Thyroid nodule   Tobacco use    Discharge Condition: Improved.  Diet recommendation: Low sodium, heart healthy  Wound care: None.  Code status: Full.   History of Present Illness:   Victor Dominguez is a 47 y.o. male with medical history significant for uncontrolled hypertension, headaches, tobacco use who presented to the ED for evaluation of recurrent persistent headache associated with nausea and vomiting.   Patient reports a history of frequent headaches, 4-5 times a week.  Normally improves after he takes Tylenol and/or Excedrin.   Recently headaches have been more frequent and persistent despite taking Tylenol/Excedrin.  He has developed pain on both lateral sides of his neck.  This occurs during his headaches and resolves when headache improves.  This morning after he took a shower headache was severe.  Headache described as generalized throbbing sensation.  He has had some nausea and 1 episode of emesis today.  He has had some blurry vision.  He denies change in speech, new weakness in his extremities, change in sensation, chest pain, dyspnea, or peripheral edema.   Patient reports smoking about half pack cigarettes per day.  He denies any alcohol use.  He reports occasional marijuana use.  He denies any cocaine or injection drug use.  He does not take any prescription medications.  He does not have a PCP.   Hospital Course by Problem:   Malignant hypertension: Severely uncontrolled hypertension.   -resumed prior meds: norvasc  and hydrochlorothiazide-- also added ARB -will need BMP 1 week -no RAS   Tobacco use: Patient reports smoking half pack per day.  He states he will quit.  He declines nicotine patch.   Thyroid nodule: A 1.9 cm hypoenhancing nodule in the left thyroid lobe was seen on CT imaging.  Nonemergent ultrasound of thyroid recommended.        Medical Consultants:      Discharge Exam:   Vitals:   10/27/22 1235 10/27/22 1240  BP: (!) 204/104 (!) 185/107  Pulse: 89 80  Resp: 15 15  Temp:    SpO2: 100% 98%   Vitals:   10/27/22 1200 10/27/22 1230 10/27/22 1235 10/27/22 1240  BP: (!) 178/114  (!) 204/104 (!) 185/107  Pulse: 81 86 89 80  Resp: 14 18 15 15   Temp:      TempSrc:      SpO2: 98% 98% 100% 98%  Weight:        General exam: Appears calm and comfortable. Denies HA   The results of significant diagnostics from this hospitalization (including imaging, microbiology, ancillary and laboratory) are listed below for reference.     Procedures and Diagnostic Studies:   CT ANGIO HEAD NECK W WO CM  Result Date: 10/26/2022 CLINICAL DATA:  Headache and neck pain EXAM: CT ANGIOGRAPHY HEAD AND NECK WITH AND WITHOUT CONTRAST TECHNIQUE: Multidetector CT imaging of the head and neck was performed using the standard protocol during bolus administration of intravenous contrast. Multiplanar CT image reconstructions and MIPs were obtained to evaluate the vascular anatomy. Carotid stenosis measurements (when applicable) are obtained  utilizing NASCET criteria, using the distal internal carotid diameter as the denominator. RADIATION DOSE REDUCTION: This exam was performed according to the departmental dose-optimization program which includes automated exposure control, adjustment of the mA and/or kV according to patient size and/or use of iterative reconstruction technique. CONTRAST:  75mL OMNIPAQUE IOHEXOL 350 MG/ML SOLN COMPARISON:  No prior CTA available, correlation is made with 10/25/2022 CT head  FINDINGS: CT HEAD FINDINGS For noncontrast findings, please see 10/25/2022 CT head. CTA NECK FINDINGS Aortic arch: Two-vessel arch with a common origin of the brachiocephalic and left common carotid arteries. Imaged portion shows no evidence of aneurysm or dissection. No significant stenosis of the major arch vessel origins. Right carotid system: No evidence of dissection, occlusion, or hemodynamically significant stenosis (greater than 50%). Left carotid system: No evidence of dissection, occlusion, or hemodynamically significant stenosis (greater than 50%). Vertebral arteries: Left dominant system. No evidence of dissection, occlusion, or hemodynamically significant stenosis (greater than 50%). Skeleton: No acute osseous abnormality. Other neck: 1.9 cm hypoenhancing nodule in the left thyroid lobe. Additional smaller hypoenhancing nodule in the right thyroid lobe. Upper chest: No focal pulmonary opacity or pleural effusion. Review of the MIP images confirms the above findings CTA HEAD FINDINGS Anterior circulation: Both internal carotid arteries are patent to the termini, without significant stenosis. A1 segments patent. Normal anterior communicating artery. Anterior cerebral arteries are patent to their distal aspects without significant stenosis. No M1 stenosis or occlusion. MCA branches perfused to their distal aspects without significant stenosis. Posterior circulation: Vertebral arteries patent to the vertebrobasilar junction without significant stenosis. The right vertebral artery primarily supplies the right PICA. Posterior inferior cerebellar arteries patent proximally. Basilar patent to its distal aspect without significant stenosis. Superior cerebellar arteries patent proximally. Patent right P1. Aplastic left P1. Fetal origin of the left PCA from the left posterior communicating artery. PCAs perfused to their distal aspects without significant stenosis. The left posterior communicating artery is not  seen. Venous sinuses: As permitted by contrast timing, patent. Anatomic variants: Fetal origin of the left PCA. No evidence of aneurysm or vascular malformation. Review of the MIP images confirms the above findings IMPRESSION: 1. No intracranial large vessel occlusion or significant stenosis. 2. No hemodynamically significant stenosis in the neck. 3. 1.9 cm hypoenhancing nodule in the left thyroid lobe. If this has not previously been evaluated, a non-emergent ultrasound of the thyroid is recommended. (Reference: J Am Coll Radiol. 2015 Feb;12(2): 143-50) Electronically Signed   By: Wiliam Ke M.D.   On: 10/26/2022 01:03   CT Head Wo Contrast  Result Date: 10/25/2022 CLINICAL DATA:  Headache 3rd rib focal EXAM: CT HEAD WITHOUT CONTRAST TECHNIQUE: Contiguous axial images were obtained from the base of the skull through the vertex without intravenous contrast. RADIATION DOSE REDUCTION: This exam was performed according to the departmental dose-optimization program which includes automated exposure control, adjustment of the mA and/or kV according to patient size and/or use of iterative reconstruction technique. COMPARISON:  None Available. FINDINGS: Brain: No evidence of acute infarction, hemorrhage, hydrocephalus, extra-axial collection or mass lesion/mass effect. Vascular: No hyperdense vessel or unexpected calcification. Skull: Normal. Negative for fracture or focal lesion. Sinuses/Orbits: The visualized paranasal sinuses are essentially clear. The mastoid air cells are unopacified. Radiopaque foreign body along the left superior orbital ridge. Other: None. IMPRESSION: Normal head CT. Radiopaque foreign body along the left superior orbital ridge. Electronically Signed   By: Charline Bills M.D.   On: 10/25/2022 20:28     Labs:   Basic  Metabolic Panel: Recent Labs  Lab 10/25/22 2105 10/26/22 0344 10/27/22 0346  NA 136 136 137  K 3.5 3.7 3.5  CL 104 101 101  CO2 23 26 27   GLUCOSE 113* 112*  110*  BUN 10 9 13   CREATININE 0.86 1.01 1.16  CALCIUM 8.9 9.0 8.7*   GFR CrCl cannot be calculated (Unknown ideal weight.). Liver Function Tests: Recent Labs  Lab 10/25/22 2105  AST 22  ALT 38  ALKPHOS 62  BILITOT 1.0  PROT 8.0  ALBUMIN 4.8   Recent Labs  Lab 10/25/22 2105  LIPASE 23   No results for input(s): "AMMONIA" in the last 168 hours. Coagulation profile No results for input(s): "INR", "PROTIME" in the last 168 hours.  CBC: Recent Labs  Lab 10/25/22 2105 10/26/22 0344  WBC 6.5 8.2  HGB 16.4 15.8  HCT 46.2 45.2  MCV 86.4 86.9  PLT 177 175   Cardiac Enzymes: No results for input(s): "CKTOTAL", "CKMB", "CKMBINDEX", "TROPONINI" in the last 168 hours. BNP: Invalid input(s): "POCBNP" CBG: No results for input(s): "GLUCAP" in the last 168 hours. D-Dimer No results for input(s): "DDIMER" in the last 72 hours. Hgb A1c No results for input(s): "HGBA1C" in the last 72 hours. Lipid Profile No results for input(s): "CHOL", "HDL", "LDLCALC", "TRIG", "CHOLHDL", "LDLDIRECT" in the last 72 hours. Thyroid function studies Recent Labs    10/26/22 0344  TSH 1.513   Anemia work up No results for input(s): "VITAMINB12", "FOLATE", "FERRITIN", "TIBC", "IRON", "RETICCTPCT" in the last 72 hours. Microbiology Recent Results (from the past 240 hour(s))  MRSA Next Gen by PCR, Nasal     Status: None   Collection Time: 10/26/22  2:07 AM   Specimen: Nasal Mucosa; Nasal Swab  Result Value Ref Range Status   MRSA by PCR Next Gen NOT DETECTED NOT DETECTED Final    Comment: (NOTE) The GeneXpert MRSA Assay (FDA approved for NASAL specimens only), is one component of a comprehensive MRSA colonization surveillance program. It is not intended to diagnose MRSA infection nor to guide or monitor treatment for MRSA infections. Test performance is not FDA approved in patients less than 81 years old. Performed at [facility], [address], [place], [place]  [address]      Discharge Instructions:   Discharge Instructions     AMB Referral to Pharmacy Medication Management   Complete by: As directed    Disease states to manage: Hypertension   Expected date of contact: Urgent - 7 Days   Diet - low sodium heart healthy   Complete by: As directed    Discharge instructions   Complete by: As directed    Your U/S did NOT show any renal artery stenosis You will need close follow up with PCP and pharmacy for further BP management   Increase activity slowly   Complete by: As directed       Allergies as of 10/27/2022   No Known Allergies      Medication List     STOP taking these medications    amoxicillin 500 MG capsule Commonly known as: AMOXIL   aspirin-acetaminophen-caffeine 250-250-65 MG tablet Commonly known as: EXCEDRIN MIGRAINE   cyclobenzaprine 10 MG tablet Commonly known as: FLEXERIL   doxycycline 100 MG capsule Commonly known as: VIBRAMYCIN       TAKE these medications    amLODipine 10 MG tablet Commonly known as: NORVASC Take 1 tablet (10 mg total) by mouth daily. Start taking on: October 28, 2022 What changed:  medication strength how much to take   hydrochlorothiazide 25 MG tablet Commonly known as: HYDRODIURIL Take 1 tablet (25 mg total) by mouth daily. Start taking on: October 28, 2022   valsartan 40 MG tablet Commonly known as: Diovan Take 1 tablet (40 mg total) by mouth daily.        Follow-up Information     [facility] Follow up.   Specialty: Internal Medicine Why: Hospital Follow Up Appointment:    Wednesday, November 02, 2022 at 9:20am   Please call at least 24 hrs in advance for cancellation if unable to keep appointment. Contact information: [address] [place] [address] [phone]                 Time coordinating discharge: 45 min  Signed:  Joseph Art DO  [facility] 10/27/2022, 3:24 PM

## 2022-10-27 NOTE — Progress Notes (Signed)
Renal artery duplex has been completed.   Results can be found under chart review under CV PROC. 10/27/2022 11:10 AM Viki Carrera RVT, RDMS

## 2022-10-27 NOTE — Plan of Care (Signed)

## 2022-10-31 ENCOUNTER — Telehealth: Payer: Self-pay | Admitting: Pharmacist

## 2022-10-31 NOTE — Progress Notes (Unsigned)
Contacted patient regarding referral for hypertension from Dr. Rhunette Croft. Appointment on [facility].    Left patient a voicemail to return my call at their convenience  Catie TClearance Coots, PharmD, BCACP, CPP Clinical Pharmacist [facility] [phone]

## 2022-11-01 ENCOUNTER — Other Ambulatory Visit: Payer: 59 | Admitting: Pharmacist

## 2022-11-01 ENCOUNTER — Encounter: Payer: Self-pay | Admitting: Pharmacist

## 2022-11-01 NOTE — Progress Notes (Signed)
11/01/2022 Name: Victor Dominguez MRN: [id number] DOB: 1975/04/30  Chief Complaint  Patient presents with   Medication Management   Hypertension   Diabetes   Hyperlipidemia    Victor Dominguez is a 47 y.o. year old male who presented for a telephone visit.   They were referred to the pharmacist by  ED provider  for assistance in managing hypertension.    Subjective:  Care Team: Primary Care Provider: establishing with [facility] tomorrow;   Medication Access/Adherence  Current Pharmacy:  [id number] - [place], [place] - [address] AT [facility] [address] [place] [address] Phone: [phone] Fax: [phone]  [id number] - [address]), [place] - [address] [place] [address] Phone: [phone] Fax: [phone]  [id number] [place], [place] - [address] AT [address] [place] [address] Phone: [phone] Fax: [phone]  [facility] [address] [place] [address] Phone: [phone] Fax: [phone]   Patient reports affordability concerns with their medications: No  Patient reports access/transportation concerns to their pharmacy: No  Patient reports adherence concerns with their medications:  No     Hypertension:  Current medications: amlodipine 10 mg daily, hydrochlorothiazide 25 mg daily, valsartan 40 mg daily   Patient does not have a validated, automated, upper arm home BP cuff  Patient reports hypotensive s/sx including dizziness, lightheadedness.   Reports he had been smoking due to stress. Notes he has quit smoking since then. Notes he had not been using a lot of salt before this hospitalization.   No renal artery stenosis. Does indicate he snores at night, believes his wife has told him about possible apneic episodes before.     Objective:   Lab Results  Component Value Date   CREATININE 1.16 10/27/2022   BUN 13 10/27/2022   NA 137 10/27/2022   K 3.5 10/27/2022   CL 101 10/27/2022   CO2 27 10/27/2022    Medications Reviewed Today     Reviewed by Alden Hipp, RPH-CPP (Pharmacist) on 11/01/22 at 1510  Med List Status: <None>   Medication Order Taking? Sig Documenting Provider Last Dose Status Informant  amLODipine (NORVASC) 10 MG tablet [id number] Yes Take 1 tablet (10 mg total) by mouth daily. Joseph Art, DO Taking Active   hydrochlorothiazide (HYDRODIURIL) 25 MG tablet [id number] Yes Take 1 tablet (25 mg total) by mouth daily. Joseph Art, DO Taking Active   valsartan (DIOVAN) 40 MG tablet [id number] Yes Take 1 tablet (40 mg total) by mouth daily. Joseph Art, DO Taking Active               Assessment/Plan:   Hypertension: - Currently uncontrolled - Reviewed long term cardiovascular and renal outcomes of uncontrolled blood pressure - Reviewed appropriate blood pressure monitoring technique and reviewed goal blood pressure. Recommended to check home blood pressure and heart rate periodically, document, and provide at future appointment. The patient has a diagnosis of hypertension and it is medically necessary for them to have access to a home device to monitor blood pressure. The patient does not have readily available insurance access to a device and cannot afford to purchase a device at this time. The patient has been counseled that they do not need to continue to receive services from [facility] for receive a device. The patient will be given  a device free of charge.  - Continue current regimen, will see blood pressure tomorrow. If uncontrolled, recommend increasing valsartan dose (pending BMP). Goal BP <130/80 - Praised for elimination of tobacco use. Provided motivational interviewing.  - Recommend to consider referral for sleep study.  - If uncontrolled on 3 max dose  agents, will consider referral to advanced hypertension clinic.     Follow Up Plan: phone call in 4 weeks  Catie TClearance Coots, PharmD, BCACP, CPP Clinical Pharmacist [facility] [phone]

## 2022-11-02 ENCOUNTER — Encounter: Payer: Self-pay | Admitting: Nurse Practitioner

## 2022-11-02 ENCOUNTER — Ambulatory Visit (INDEPENDENT_AMBULATORY_CARE_PROVIDER_SITE_OTHER): Payer: 59 | Admitting: Nurse Practitioner

## 2022-11-02 VITALS — BP 131/89 | HR 88 | Temp 97.0°F | Ht 76.0 in | Wt 253.2 lb

## 2022-11-02 DIAGNOSIS — Z09 Encounter for follow-up examination after completed treatment for conditions other than malignant neoplasm: Secondary | ICD-10-CM | POA: Diagnosis not present

## 2022-11-02 DIAGNOSIS — I10 Essential (primary) hypertension: Secondary | ICD-10-CM | POA: Diagnosis not present

## 2022-11-02 DIAGNOSIS — Z1321 Encounter for screening for nutritional disorder: Secondary | ICD-10-CM

## 2022-11-02 DIAGNOSIS — Z1211 Encounter for screening for malignant neoplasm of colon: Secondary | ICD-10-CM

## 2022-11-02 DIAGNOSIS — Z131 Encounter for screening for diabetes mellitus: Secondary | ICD-10-CM | POA: Diagnosis not present

## 2022-11-02 DIAGNOSIS — E041 Nontoxic single thyroid nodule: Secondary | ICD-10-CM

## 2022-11-02 DIAGNOSIS — G4700 Insomnia, unspecified: Secondary | ICD-10-CM | POA: Diagnosis not present

## 2022-11-02 DIAGNOSIS — R0683 Snoring: Secondary | ICD-10-CM

## 2022-11-02 DIAGNOSIS — Z13228 Encounter for screening for other metabolic disorders: Secondary | ICD-10-CM

## 2022-11-02 DIAGNOSIS — Z720 Tobacco use: Secondary | ICD-10-CM | POA: Diagnosis not present

## 2022-11-02 DIAGNOSIS — Z1329 Encounter for screening for other suspected endocrine disorder: Secondary | ICD-10-CM | POA: Diagnosis not present

## 2022-11-02 DIAGNOSIS — Z130 Encounter for screening for diseases of the blood and blood-forming organs and certain disorders involving the immune mechanism: Secondary | ICD-10-CM

## 2022-11-02 DIAGNOSIS — R0681 Apnea, not elsewhere classified: Secondary | ICD-10-CM | POA: Insufficient documentation

## 2022-11-02 MED ORDER — AMLODIPINE BESYLATE 10 MG PO TABS
10.0000 mg | ORAL_TABLET | Freq: Every day | ORAL | 1 refills | Status: DC
Start: 2022-11-02 — End: 2022-11-30

## 2022-11-02 MED ORDER — HYDROCHLOROTHIAZIDE 25 MG PO TABS
25.0000 mg | ORAL_TABLET | Freq: Every day | ORAL | 1 refills | Status: DC
Start: 2022-11-02 — End: 2022-11-30

## 2022-11-02 MED ORDER — VALSARTAN 40 MG PO TABS
40.0000 mg | ORAL_TABLET | Freq: Every day | ORAL | 1 refills | Status: DC
Start: 2022-11-02 — End: 2022-11-30

## 2022-11-02 NOTE — Assessment & Plan Note (Addendum)
He has quit smoking cigarettes since his recent hospitalization Patient congratulated on his efforts at smoking cessation We discussed starting on nicotine patch to help with cravings but the patient refused

## 2022-11-02 NOTE — Assessment & Plan Note (Signed)
Hospital discharge summary labs imaging studies reviewed

## 2022-11-02 NOTE — Assessment & Plan Note (Signed)
Home sleep study ordered to rule out sleep apnea

## 2022-11-02 NOTE — Assessment & Plan Note (Signed)
Thyroid ultrasound ordered  Results of recent CT scan below MPRESSION: 1. No intracranial large vessel occlusion or significant stenosis. 2. No hemodynamically significant stenosis in the neck. 3. 1.9 cm hypoenhancing nodule in the left thyroid lobe. If this has not previously been evaluated, a non-emergent ultrasound of the thyroid is recommended. (Reference: J Am Coll Radiol. 2015 Feb;12(2): 143-50)

## 2022-11-02 NOTE — Assessment & Plan Note (Addendum)
BP Readings from Last 3 Encounters:  11/02/22 131/89  10/27/22 (!) 185/107  08/28/20 (!) 164/104   HTN Controlled .  On amlodipine 10 mg daily, hydrochlorothiazide 25 mg daily, valsartan 40 mg daily. Blood pressure monitor given to the patient today Continue current medications. No changes in management. Discussed DASH diet and dietary sodium restrictions Continue to increase dietary efforts and exercise.  BMP today

## 2022-11-02 NOTE — Progress Notes (Signed)
New Patient Office Visit  Subjective:  Patient ID: Victor Dominguez, male    DOB: 1975/07/04  Age: 46 y.o. MRN: [id number]  CC:  Chief Complaint  Patient presents with   Hospitalization Follow-up   Establish Care    fasting    HPI Victor Dominguez is a 47 y.o. male  has a past medical history of Hypertension, Malignant hypertension (10/26/2022), Thyroid nodule (10/26/2022), and Tobacco use (10/26/2022).  Patient presents to establish care for his chronic medical conditions and for hospital discharge follow-up.  Has no previous PCP   Patient was on admission at the hospital from 10/25/2022 to 10/27/2022 for malignant hypertension.  He has been taking amlodipine 10 mg daily, hydrochlorothiazide 25 mg daily, valsartan 40 mg daily.  Patient denies chest pain, dizziness, headaches, shortness of breath.  Patient complains of snoring, apnea, gets about 6 hours of sleep nightly, states that he falls asleep easily when he is a passenger in a car.  We discussed getting a sleep study done  Patient declined a flu vaccine.  Encouraged to consider getting the flu vaccine and a Tdap vaccine.  Former tobacco smoker.  Smoked 1 and half packet of cigarettes daily for many years.  Stated that he has quit smoking since his recent hospitalization, he now vapes daily.  Patient denies any adverse reactions to current medications     Past Medical History:  Diagnosis Date   Hypertension    Malignant hypertension 10/26/2022   Thyroid nodule 10/26/2022   Tobacco use 10/26/2022    History reviewed. No pertinent surgical history.  Family History  Problem Relation Age of Onset   Heart disease Mother    Stroke Mother    Parkinson's disease Mother    Hyperlipidemia Brother    Cancer Maternal Grandmother    Cancer Other    Prostate cancer Neg Hx     Social History   Socioeconomic History   Marital status: Married    Spouse name: Not on file   Number of children: 4   Years of education:  Not on file   Highest education level: Not on file  Occupational History   Not on file  Tobacco Use   Smoking status: Former    Types: Cigarettes   Smokeless tobacco: Never  Vaping Use   Vaping status: Every Day  Substance and Sexual Activity   Alcohol use: Not Currently   Drug use: Never   Sexual activity: Yes  Other Topics Concern   Not on file  Social History Narrative   Lives with his wife    Social Determinants of Health   Financial Resource Strain: Medium Risk (11/01/2022)   Overall Financial Resource Strain (CARDIA)    Difficulty of Paying Living Expenses: Somewhat hard  Food Insecurity: Not on file  Transportation Needs: Not on file  Physical Activity: Not on file  Stress: Not on file  Social Connections: Not on file  Intimate Partner Violence: Not on file    ROS Review of Systems  Constitutional:  Negative for activity change, appetite change, chills, diaphoresis, fatigue, fever and unexpected weight change.  HENT:  Negative for congestion, dental problem, drooling and ear discharge.   Eyes:  Negative for pain, discharge, redness and itching.  Respiratory:  Negative for apnea, cough, choking, chest tightness, shortness of breath and wheezing.   Cardiovascular: Negative.  Negative for chest pain, palpitations and leg swelling.  Gastrointestinal:  Negative for abdominal distention, abdominal pain, anal bleeding, blood in stool, constipation, diarrhea and  vomiting.  Endocrine: Negative for polydipsia, polyphagia and polyuria.  Genitourinary:  Negative for difficulty urinating, flank pain, frequency and genital sores.  Musculoskeletal: Negative.  Negative for arthralgias, back pain, gait problem and joint swelling.  Skin:  Negative for color change, pallor and rash.  Neurological:  Negative for dizziness, facial asymmetry, light-headedness, numbness and headaches.  Psychiatric/Behavioral:  Negative for agitation, behavioral problems, confusion, hallucinations,  self-injury, sleep disturbance and suicidal ideas.     Objective:   Today's Vitals: BP 131/89   Pulse 88   Temp (!) 97 F (36.1 C)   Ht 6\' 4"  (1.93 m)   Wt 253 lb 3.2 oz (114.9 kg)   SpO2 97%   BMI 30.82 kg/m   Physical Exam Vitals and nursing note reviewed.  Constitutional:      General: He is not in acute distress.    Appearance: Normal appearance. He is obese. He is not ill-appearing, toxic-appearing or diaphoretic.  HENT:     Mouth/Throat:     Mouth: Mucous membranes are moist.     Pharynx: Oropharynx is clear. No oropharyngeal exudate or posterior oropharyngeal erythema.  Eyes:     General: No scleral icterus.       Right eye: No discharge.        Left eye: No discharge.     Extraocular Movements: Extraocular movements intact.     Conjunctiva/sclera: Conjunctivae normal.  Cardiovascular:     Rate and Rhythm: Normal rate and regular rhythm.     Pulses: Normal pulses.     Heart sounds: Normal heart sounds. No murmur heard.    No friction rub. No gallop.  Pulmonary:     Effort: Pulmonary effort is normal. No respiratory distress.     Breath sounds: Normal breath sounds. No stridor. No wheezing, rhonchi or rales.  Chest:     Chest wall: No tenderness.  Abdominal:     General: There is no distension.     Palpations: Abdomen is soft.     Tenderness: There is no abdominal tenderness. There is no right CVA tenderness, left CVA tenderness or guarding.  Musculoskeletal:        General: No swelling, tenderness, deformity or signs of injury.     Right lower leg: No edema.     Left lower leg: No edema.  Skin:    General: Skin is warm and dry.     Capillary Refill: Capillary refill takes less than 2 seconds.     Coloration: Skin is not jaundiced or pale.     Findings: No bruising, erythema or lesion.  Neurological:     Mental Status: He is alert and oriented to person, place, and time.     Motor: No weakness.     Coordination: Coordination normal.     Gait: Gait  normal.  Psychiatric:        Mood and Affect: Mood normal.        Behavior: Behavior normal.        Thought Content: Thought content normal.        Judgment: Judgment normal.     Assessment & Plan:   Problem List Items Addressed This Visit       Cardiovascular and Mediastinum   Malignant hypertension - Primary    BP Readings from Last 3 Encounters:  11/02/22 131/89  10/27/22 (!) 185/107  08/28/20 (!) 164/104   HTN Controlled .  On amlodipine 10 mg daily, hydrochlorothiazide 25 mg daily, valsartan 40 mg daily. Blood pressure monitor  given to the patient today Continue current medications. No changes in management. Discussed DASH diet and dietary sodium restrictions Continue to increase dietary efforts and exercise.  BMP today        Relevant Medications   amLODipine (NORVASC) 10 MG tablet   hydrochlorothiazide (HYDRODIURIL) 25 MG tablet   valsartan (DIOVAN) 40 MG tablet   Other Relevant Orders   Basic Metabolic Panel     Endocrine   Thyroid nodule    Thyroid ultrasound ordered  Results of recent CT scan below MPRESSION: 1. No intracranial large vessel occlusion or significant stenosis. 2. No hemodynamically significant stenosis in the neck. 3. 1.9 cm hypoenhancing nodule in the left thyroid lobe. If this has not previously been evaluated, a non-emergent ultrasound of the thyroid is recommended. (Reference: J Am Coll Radiol. 2015 Feb;12(2): 143-50)      Relevant Orders   US THYROID     Other   Tobacco use    He has quit smoking cigarettes since his recent hospitalization Patient congratulated on his efforts at smoking cessation We discussed starting on nicotine patch to help with cravings but the patient refused      Hospital discharge follow-up    Hospital discharge summary labs imaging studies reviewed      Snoring    Home sleep study ordered to rule out sleep apnea      Relevant Orders   Home sleep test   Apnea    Home sleep study ordered to  rule out sleep apnea      Relevant Orders   Home sleep test   Insomnia    Home sleep study ordered to rule out sleep apnea      Relevant Orders   Home sleep test   Vapes nicotine containing substance    He has quit smoking cigarettes but has been vaping Need to quit vaping discussed      Other Visit Diagnoses     Screening for colon cancer       Relevant Orders   Cologuard   Screening for diabetes mellitus       Relevant Orders   Hemoglobin A1c   Screening for endocrine, nutritional, metabolic and immunity disorder       Relevant Orders   Lipid panel   Hepatitis C antibody       Outpatient Encounter Medications as of 11/02/2022  Medication Sig   [DISCONTINUED] amLODipine (NORVASC) 10 MG tablet Take 1 tablet (10 mg total) by mouth daily.   [DISCONTINUED] hydrochlorothiazide (HYDRODIURIL) 25 MG tablet Take 1 tablet (25 mg total) by mouth daily.   [DISCONTINUED] valsartan (DIOVAN) 40 MG tablet Take 1 tablet (40 mg total) by mouth daily.   amLODipine (NORVASC) 10 MG tablet Take 1 tablet (10 mg total) by mouth daily.   hydrochlorothiazide (HYDRODIURIL) 25 MG tablet Take 1 tablet (25 mg total) by mouth daily.   valsartan (DIOVAN) 40 MG tablet Take 1 tablet (40 mg total) by mouth daily.   No facility-administered encounter medications on file as of 11/02/2022.    Follow-up: Return in about 4 weeks (around 11/30/2022) for HTN.   Donell Beers, FNP

## 2022-11-02 NOTE — Patient Instructions (Addendum)
Malignant hypertension  - Basic Metabolic Panel - amLODipine (NORVASC) 10 MG tablet; Take 1 tablet (10 mg total) by mouth daily.  Dispense: 90 tablet; Refill: 1 - hydrochlorothiazide (HYDRODIURIL) 25 MG tablet; Take 1 tablet (25 mg total) by mouth daily.  Dispense: 90 tablet; Refill: 1 - valsartan (DIOVAN) 40 MG tablet; Take 1 tablet (40 mg total) by mouth daily.  Dispense: 90 tablet; Refill: 1  Screening for colon cancer  - Cologuard   Insomnia, unspecified type  - Home sleep test   Check your blood pressure twice weekly, and any time you have concerning symptoms like headache, chest pain, dizziness, shortness of breath, or vision changes.   Our goal is less than 130/80.  To appropriately check your blood pressure, make sure you do the following:  1) Avoid caffeine, exercise, or tobacco products for 30 minutes before checking. Empty your bladder. 2) Sit with your back supported in a flat-backed chair. Rest your arm on something flat (arm of the chair, table, etc). 3) Sit still with your feet flat on the floor, resting, for at least 5 minutes.  4) Check your blood pressure. Take 1-2 readings.  5) Write down these readings and bring with you to any provider appointments.  Bring your home blood pressure machine with you to a provider's office for accuracy comparison at least once a year.   Make sure you take your blood pressure medications before you come to any office visit, even if you were asked to fast for labs.

## 2022-11-02 NOTE — Assessment & Plan Note (Signed)
He has quit smoking cigarettes but has been vaping Need to quit vaping discussed

## 2022-11-03 LAB — BASIC METABOLIC PANEL
BUN/Creatinine Ratio: 12 (ref 9–20)
BUN: 11 mg/dL (ref 6–24)
CO2: 23 mmol/L (ref 20–29)
Calcium: 9 mg/dL (ref 8.7–10.2)
Chloride: 98 mmol/L (ref 96–106)
Creatinine, Ser: 0.89 mg/dL (ref 0.76–1.27)
Glucose: 110 mg/dL — ABNORMAL HIGH (ref 70–99)
Potassium: 3.3 mmol/L — ABNORMAL LOW (ref 3.5–5.2)
Sodium: 138 mmol/L (ref 134–144)
eGFR: 107 mL/min/{1.73_m2} (ref >59)

## 2022-11-03 LAB — HEPATITIS C ANTIBODY: Hep C Virus Ab: NONREACTIVE

## 2022-11-03 LAB — LIPID PANEL
Chol/HDL Ratio: 4.9 ratio (ref 0.0–5.0)
Cholesterol, Total: 132 mg/dL (ref 100–199)
HDL: 27 mg/dL — ABNORMAL LOW (ref >39)
LDL Chol Calc (NIH): 50 mg/dL (ref 0–99)
Triglycerides: 359 mg/dL — ABNORMAL HIGH (ref 0–149)
VLDL Cholesterol Cal: 55 mg/dL — ABNORMAL HIGH (ref 5–40)

## 2022-11-03 LAB — HEMOGLOBIN A1C
Est. average glucose Bld gHb Est-mCnc: 100 mg/dL
Hgb A1c MFr Bld: 5.1 % (ref 4.8–5.6)

## 2022-11-04 ENCOUNTER — Other Ambulatory Visit: Payer: Self-pay | Admitting: Nurse Practitioner

## 2022-11-04 DIAGNOSIS — E876 Hypokalemia: Secondary | ICD-10-CM

## 2022-11-04 MED ORDER — POTASSIUM CHLORIDE CRYS ER 20 MEQ PO TBCR
40.0000 meq | EXTENDED_RELEASE_TABLET | Freq: Every day | ORAL | 0 refills | Status: DC
Start: 2022-11-04 — End: 2022-12-08

## 2022-11-09 ENCOUNTER — Ambulatory Visit
Admission: RE | Admit: 2022-11-09 | Discharge: 2022-11-09 | Disposition: A | Discharge status: Home / Self Care | Payer: 59 | Source: Ambulatory Visit | Attending: Nurse Practitioner | Admitting: Nurse Practitioner

## 2022-11-09 DIAGNOSIS — E041 Nontoxic single thyroid nodule: Secondary | ICD-10-CM | POA: Diagnosis not present

## 2022-11-16 ENCOUNTER — Telehealth: Payer: Self-pay

## 2022-11-16 NOTE — Telephone Encounter (Signed)
Left message for patient to call back regarding results.

## 2022-11-16 NOTE — Telephone Encounter (Signed)
-----   Message from [place] sent at 11/14/2022  8:03 PM EDT ----- Multiple benign-appearing cystic nodules are scattered throughout the thyroid gland. None of these meet criteria for further dedicated follow-up or biopsy. No further intervention needed

## 2022-11-30 ENCOUNTER — Ambulatory Visit (INDEPENDENT_AMBULATORY_CARE_PROVIDER_SITE_OTHER): Payer: 59 | Admitting: Nurse Practitioner

## 2022-11-30 ENCOUNTER — Other Ambulatory Visit (HOSPITAL_COMMUNITY): Payer: Self-pay

## 2022-11-30 ENCOUNTER — Encounter: Payer: Self-pay | Admitting: Nurse Practitioner

## 2022-11-30 VITALS — BP 137/85 | HR 80 | Temp 97.5°F | Wt 257.4 lb

## 2022-11-30 DIAGNOSIS — I10 Essential (primary) hypertension: Secondary | ICD-10-CM | POA: Diagnosis not present

## 2022-11-30 DIAGNOSIS — E876 Hypokalemia: Secondary | ICD-10-CM | POA: Insufficient documentation

## 2022-11-30 MED ORDER — HYDROCHLOROTHIAZIDE 25 MG PO TABS
25.0000 mg | ORAL_TABLET | Freq: Every day | ORAL | 1 refills | Status: DC
  Filled 2022-11-30 (×2): qty 30, 30d supply, fill #0

## 2022-11-30 MED ORDER — AMLODIPINE BESYLATE 10 MG PO TABS
10.0000 mg | ORAL_TABLET | Freq: Every day | ORAL | 1 refills | Status: DC
  Filled 2022-11-30: qty 30, 30d supply, fill #0

## 2022-11-30 MED ORDER — VALSARTAN 40 MG PO TABS
40.0000 mg | ORAL_TABLET | Freq: Every day | ORAL | 1 refills | Status: DC
  Filled 2022-11-30 (×2): qty 30, 30d supply, fill #0

## 2022-11-30 NOTE — Assessment & Plan Note (Signed)
Lab Results  Component Value Date   NA 138 11/02/2022   K 3.3 (L) 11/02/2022   CO2 23 11/02/2022   GLUCOSE 110 (H) 11/02/2022   BUN 11 11/02/2022   CREATININE 0.89 11/02/2022   CALCIUM 9.0 11/02/2022   EGFR 107 11/02/2022   GFRNONAA >60 10/27/2022  Potassium supplement was ordered but the patient did not take the medication Rechecking labs today

## 2022-11-30 NOTE — Assessment & Plan Note (Signed)
BP Readings from Last 3 Encounters:  11/30/22 137/85  11/02/22 131/89  10/27/22 (!) 185/107   HTN Controlled .  On amlodipine 10 mg daily, hydrochlorothiazide 25 mg daily, valsartan 40 mg daily Continue current medications Encouraged to monitor blood pressure at home and report blood pressure readings greater than 140/90 Continue current medications. No changes in management. Discussed DASH diet and dietary sodium restrictions Continue to increase dietary efforts and exercise.  Follow-up in 4 months

## 2022-11-30 NOTE — Progress Notes (Signed)
Established Patient Office Visit  Subjective:  Patient ID: Victor Dominguez, male    DOB: Apr 30, 1975  Age: 47 y.o. MRN: [id number]  CC:  Chief Complaint  Patient presents with   Hypertension    HPI Victor Dominguez is a 47 y.o. male  has a past medical history of Hypertension, Malignant hypertension (10/26/2022), Thyroid nodule (10/26/2022), and Tobacco use (10/26/2022).  Patient presents for follow-up for hypertension.  Patient denies any adverse reactions to his current medications  Hypertension.  Currently on amlodipine 10 mg daily, hydrochlorothiazide 25 mg daily, valsartan 40 mg daily.   He Monitors his blood pressure sometimes, he reported readings of around 130s over 80s.  Patient denies chest pain shortness of breath, edema.  He has been trying to follow a heart healthy diet and increase exercise  He has received the Cologuard testing kit but has not completed the test.  Patient encouraged to complete the test as ordered  Flu vaccine declined.  Patient encouraged to consider getting flu vaccine .  Also encouraged to consider getting Tdap vaccine at the pharmacy. Past Medical History:  Diagnosis Date   Hypertension    Malignant hypertension 10/26/2022   Thyroid nodule 10/26/2022   Tobacco use 10/26/2022    History reviewed. No pertinent surgical history.  Family History  Problem Relation Age of Onset   Heart disease Mother    Stroke Mother    Parkinson's disease Mother    Hyperlipidemia Brother    Cancer Maternal Grandmother    Cancer Other    Prostate cancer Neg Hx     Social History   Socioeconomic History   Marital status: Married    Spouse name: Not on file   Number of children: 4   Years of education: Not on file   Highest education level: Not on file  Occupational History   Not on file  Tobacco Use   Smoking status: Former    Types: Cigarettes   Smokeless tobacco: Never  Vaping Use   Vaping status: Every Day  Substance and Sexual Activity    Alcohol use: Not Currently   Drug use: Never   Sexual activity: Yes  Other Topics Concern   Not on file  Social History Narrative   Lives with his wife    Social Determinants of Health   Financial Resource Strain: Medium Risk (11/01/2022)   Overall Financial Resource Strain (CARDIA)    Difficulty of Paying Living Expenses: Somewhat hard  Food Insecurity: Not on file  Transportation Needs: Not on file  Physical Activity: Not on file  Stress: Not on file  Social Connections: Not on file  Intimate Partner Violence: Not on file    Outpatient Medications Prior to Visit  Medication Sig Dispense Refill   amLODipine (NORVASC) 10 MG tablet Take 1 tablet (10 mg total) by mouth daily. 90 tablet 1   hydrochlorothiazide (HYDRODIURIL) 25 MG tablet Take 1 tablet (25 mg total) by mouth daily. 90 tablet 1   valsartan (DIOVAN) 40 MG tablet Take 1 tablet (40 mg total) by mouth daily. 90 tablet 1   potassium chloride SA (KLOR-CON M) 20 MEQ tablet Take 2 tablets (40 mEq total) by mouth daily. (Patient not taking: Reported on 11/30/2022) 4 tablet 0   No facility-administered medications prior to visit.    No Known Allergies  ROS Review of Systems  Constitutional:  Negative for appetite change, chills, fatigue and fever.  HENT:  Negative for congestion, postnasal drip, rhinorrhea and sneezing.   Respiratory:  Negative for cough, shortness of breath and wheezing.   Cardiovascular:  Negative for chest pain, palpitations and leg swelling.  Gastrointestinal:  Negative for abdominal pain, constipation, nausea and vomiting.  Genitourinary:  Negative for difficulty urinating, dysuria, flank pain and frequency.  Musculoskeletal:  Negative for arthralgias, back pain, joint swelling and myalgias.  Skin:  Negative for color change, pallor, rash and wound.  Neurological:  Negative for dizziness, facial asymmetry, weakness, numbness and headaches.  Psychiatric/Behavioral:  Negative for behavioral problems,  confusion, self-injury and suicidal ideas.       Objective:    Physical Exam Vitals and nursing note reviewed.  Constitutional:      General: He is not in acute distress.    Appearance: Normal appearance. He is obese. He is not ill-appearing, toxic-appearing or diaphoretic.  HENT:     Mouth/Throat:     Mouth: Mucous membranes are moist.     Pharynx: Oropharynx is clear. No oropharyngeal exudate or posterior oropharyngeal erythema.  Eyes:     General: No scleral icterus.       Right eye: No discharge.        Left eye: No discharge.     Extraocular Movements: Extraocular movements intact.     Conjunctiva/sclera: Conjunctivae normal.  Cardiovascular:     Rate and Rhythm: Normal rate and regular rhythm.     Pulses: Normal pulses.     Heart sounds: Normal heart sounds. No murmur heard.    No friction rub. No gallop.  Pulmonary:     Effort: Pulmonary effort is normal. No respiratory distress.     Breath sounds: Normal breath sounds. No stridor. No wheezing, rhonchi or rales.  Chest:     Chest wall: No tenderness.  Abdominal:     General: There is no distension.     Palpations: Abdomen is soft.     Tenderness: There is no abdominal tenderness. There is no right CVA tenderness, left CVA tenderness or guarding.  Musculoskeletal:        General: No swelling, tenderness, deformity or signs of injury.     Right lower leg: No edema.     Left lower leg: No edema.  Skin:    General: Skin is warm and dry.     Capillary Refill: Capillary refill takes 2 to 3 seconds.     Coloration: Skin is not jaundiced or pale.     Findings: No bruising, erythema or lesion.  Neurological:     Mental Status: He is alert and oriented to person, place, and time.     Motor: No weakness.     Coordination: Coordination normal.     Gait: Gait normal.  Psychiatric:        Mood and Affect: Mood normal.        Behavior: Behavior normal.        Thought Content: Thought content normal.        Judgment:  Judgment normal.     BP 137/85   Pulse 80   Temp (!) 97.5 F (36.4 C)   Wt 257 lb 6.4 oz (116.8 kg)   SpO2 100%   BMI 31.33 kg/m  Wt Readings from Last 3 Encounters:  11/30/22 257 lb 6.4 oz (116.8 kg)  11/02/22 253 lb 3.2 oz (114.9 kg)  10/25/22 237 lb (107.5 kg)    Lab Results  Component Value Date   TSH 1.513 10/26/2022   Lab Results  Component Value Date   WBC 8.2 10/26/2022   HGB 15.8  10/26/2022   HCT 45.2 10/26/2022   MCV 86.9 10/26/2022   PLT 175 10/26/2022   Lab Results  Component Value Date   NA 138 11/02/2022   K 3.3 (L) 11/02/2022   CO2 23 11/02/2022   GLUCOSE 110 (H) 11/02/2022   BUN 11 11/02/2022   CREATININE 0.89 11/02/2022   BILITOT 1.0 10/25/2022   ALKPHOS 62 10/25/2022   AST 22 10/25/2022   ALT 38 10/25/2022   PROT 8.0 10/25/2022   ALBUMIN 4.8 10/25/2022   CALCIUM 9.0 11/02/2022   ANIONGAP 9 10/27/2022   EGFR 107 11/02/2022   Lab Results  Component Value Date   CHOL 132 11/02/2022   Lab Results  Component Value Date   HDL 27 (L) 11/02/2022   Lab Results  Component Value Date   LDLCALC 50 11/02/2022   Lab Results  Component Value Date   TRIG 359 (H) 11/02/2022   Lab Results  Component Value Date   CHOLHDL 4.9 11/02/2022   Lab Results  Component Value Date   HGBA1C 5.1 11/02/2022      Assessment & Plan:   Problem List Items Addressed This Visit       Cardiovascular and Mediastinum   Malignant hypertension    BP Readings from Last 3 Encounters:  11/30/22 137/85  11/02/22 131/89  10/27/22 (!) 185/107   HTN Controlled .  On amlodipine 10 mg daily, hydrochlorothiazide 25 mg daily, valsartan 40 mg daily Continue current medications Encouraged to monitor blood pressure at home and report blood pressure readings greater than 140/90 Continue current medications. No changes in management. Discussed DASH diet and dietary sodium restrictions Continue to increase dietary efforts and exercise.  Follow-up in 4 months         Relevant Medications   amLODipine (NORVASC) 10 MG tablet   hydrochlorothiazide (HYDRODIURIL) 25 MG tablet   valsartan (DIOVAN) 40 MG tablet     Other   Hypokalemia - Primary    Lab Results  Component Value Date   NA 138 11/02/2022   K 3.3 (L) 11/02/2022   CO2 23 11/02/2022   GLUCOSE 110 (H) 11/02/2022   BUN 11 11/02/2022   CREATININE 0.89 11/02/2022   CALCIUM 9.0 11/02/2022   EGFR 107 11/02/2022   GFRNONAA >60 10/27/2022  Potassium supplement was ordered but the patient did not take the medication Rechecking labs today      Relevant Orders   Potassium    Meds ordered this encounter  Medications   amLODipine (NORVASC) 10 MG tablet    Sig: Take 1 tablet (10 mg total) by mouth daily.    Dispense:  90 tablet    Refill:  1   hydrochlorothiazide (HYDRODIURIL) 25 MG tablet    Sig: Take 1 tablet (25 mg total) by mouth daily.    Dispense:  90 tablet    Refill:  1   valsartan (DIOVAN) 40 MG tablet    Sig: Take 1 tablet (40 mg total) by mouth daily.    Dispense:  90 tablet    Refill:  1    Follow-up: Return in about 4 months (around 04/02/2023) for HTN.    Donell Beers, FNP

## 2022-11-30 NOTE — Patient Instructions (Signed)
Please consider getting the flu vaccine and Tdap vaccine   1. Malignant hypertension  - amLODipine (NORVASC) 10 MG tablet; Take 1 tablet (10 mg total) by mouth daily.  Dispense: 90 tablet; Refill: 1 - hydrochlorothiazide (HYDRODIURIL) 25 MG tablet; Take 1 tablet (25 mg total) by mouth daily.  Dispense: 90 tablet; Refill: 1 - valsartan (DIOVAN) 40 MG tablet; Take 1 tablet (40 mg total) by mouth daily.  Dispense: 90 tablet; Refill: 1    Around 3 times per week, check your blood pressure 2 times per day. once in the morning and once in the evening. The readings should be at least one minute apart. Write down these values and bring them to your next nurse visit/appointment.  When you check your BP, make sure you have been doing something calm/relaxing 5 minutes prior to checking. Both feet should be flat on the floor and you should be sitting. Use your left arm and make sure it is in a relaxed position (on a table), and that the cuff is at the approximate level/height of your heart.   Your blood pressure goal is less than 130/80  It is important that you exercise regularly at least 30 minutes 5 times a week as tolerated  Think about what you will eat, plan ahead. Choose " clean, green, fresh or frozen" over canned, processed or packaged foods which are more sugary, salty and fatty. 70 to 75% of food eaten should be vegetables and fruit. Three meals at set times with snacks allowed between meals, but they must be fruit or vegetables. Aim to eat over a 12 hour period , example 7 am to 7 pm, and STOP after  your last meal of the day. Drink water,generally about 64 ounces per day, no other drink is as healthy. Fruit juice is best enjoyed in a healthy way, by EATING the fruit.  Thanks for choosing [facility] we consider it a privelige to serve you.

## 2022-12-01 LAB — POTASSIUM: Potassium: 3.5 mmol/L (ref 3.5–5.2)

## 2022-12-08 ENCOUNTER — Other Ambulatory Visit (INDEPENDENT_AMBULATORY_CARE_PROVIDER_SITE_OTHER): Payer: 59 | Admitting: Pharmacist

## 2022-12-08 ENCOUNTER — Encounter: Payer: Self-pay | Admitting: *Deleted

## 2022-12-08 DIAGNOSIS — E876 Hypokalemia: Secondary | ICD-10-CM

## 2022-12-08 DIAGNOSIS — I10 Essential (primary) hypertension: Secondary | ICD-10-CM

## 2022-12-08 NOTE — Progress Notes (Signed)
   12/08/2022 Name: Victor Dominguez MRN: [id number] DOB: [birth year]  Chief Complaint  Patient presents with   Medication Management    Victor Dominguez is a 47 y.o. year old male who presented for a telephone visit.   They were referred to the pharmacist by their PCP for assistance in managing hypertension.    Subjective:  Care Team: Primary Care Provider: Donell Beers, FNP ; Next Scheduled Visit: 03/28/22  Medication Access/Adherence  Current Pharmacy:  [id number] [place], [place] - [address] AT [facility] [address] [place] [address] Phone: [phone] Fax: [phone]  [facility] [address] [place] [address] Phone: [phone] Fax: [phone]   Patient reports affordability concerns with their medications: No  Patient reports access/transportation concerns to their pharmacy: No  Patient reports adherence concerns with their medications:  No     Hypertension:  Current medications: amlodipine 10 mg daily, valsartan 40 mg daily, hydrochlorothiazide 25 mg daily  Patient has a validated, automated, upper arm home BP cuff Current blood pressure readings readings: 130s/80-90s  Patient reports some episodes of hypotensive s/sx including dizziness, lightheadedness if moving positions quickly.  Patient denies hypertensive symptoms including headache, chest pain, shortness of breath   Objective:  Lab Results  Component Value Date   HGBA1C 5.1 11/02/2022    Lab Results  Component Value Date   CREATININE 0.89 11/02/2022   BUN 11 11/02/2022   NA 138 11/02/2022   K 3.5 11/30/2022   CL 98 11/02/2022   CO2 23 11/02/2022    Lab Results  Component Value Date   CHOL 132 11/02/2022   HDL 27 (L) 11/02/2022   LDLCALC 50 11/02/2022   TRIG 359 (H) 11/02/2022   CHOLHDL 4.9 11/02/2022    Medications Reviewed Today     Reviewed by Alden Hipp, RPH-CPP  (Pharmacist) on 12/08/22 at 1315  Med List Status: <None>   Medication Order Taking? Sig Documenting Provider Last Dose Status Informant  amLODipine (NORVASC) 10 MG tablet [id number] Yes Take 1 tablet (10 mg total) by mouth daily. Donell Beers, FNP Taking Active   hydrochlorothiazide (HYDRODIURIL) 25 MG tablet [id number] Yes Take 1 tablet (25 mg total) by mouth daily. Donell Beers, FNP Taking Active   valsartan (DIOVAN) 40 MG tablet [id number] Yes Take 1 tablet (40 mg total) by mouth daily. Donell Beers, FNP Taking Active               Assessment/Plan:   Hypertension: - Currently uncontrolled and possible side effects of therapy. Hydrochlorothiazide likely lowering potassium level. Possible primary aldosterone with hypertension and hypokalemia.  - Recommend to increase valsartan to 160 mg daily, reduce hydrochlorothiazide to 12.5 mg daily. Continue amlodipine 10 mg daily. BMP in 2 weeks. Also recommend to order plasma renin/aldosterone to evaluate for primary aldosterone. Will collaborate with PCP to place orders.     Follow Up Plan: phone call in 4 weeks  Catie TClearance Coots, PharmD, BCACP, CPP Clinical Pharmacist [facility] [phone]

## 2022-12-09 MED ORDER — HYDROCHLOROTHIAZIDE 12.5 MG PO TABS: 12.5000 mg | ORAL_TABLET | Freq: Every day | ORAL | 3 refills | Status: AC

## 2022-12-09 MED ORDER — VALSARTAN 160 MG PO TABS: 160.0000 mg | ORAL_TABLET | Freq: Every day | ORAL | 3 refills | Status: AC

## 2022-12-22 ENCOUNTER — Other Ambulatory Visit: Payer: Self-pay

## 2022-12-29 ENCOUNTER — Other Ambulatory Visit: Payer: 59

## 2022-12-29 DIAGNOSIS — E876 Hypokalemia: Secondary | ICD-10-CM

## 2022-12-29 DIAGNOSIS — I10 Essential (primary) hypertension: Secondary | ICD-10-CM

## 2022-12-29 IMAGING — CR DG CHEST 2V
2 series · 2 of 2 positions shown · non-contrast
Comparison: CT abdomen pelvis 06/05/2012.

CLINICAL DATA: Pt to the ED with reports of fever X2 days along
with body aches.

EXAM:
CHEST - 2 VIEW

[chest pa]
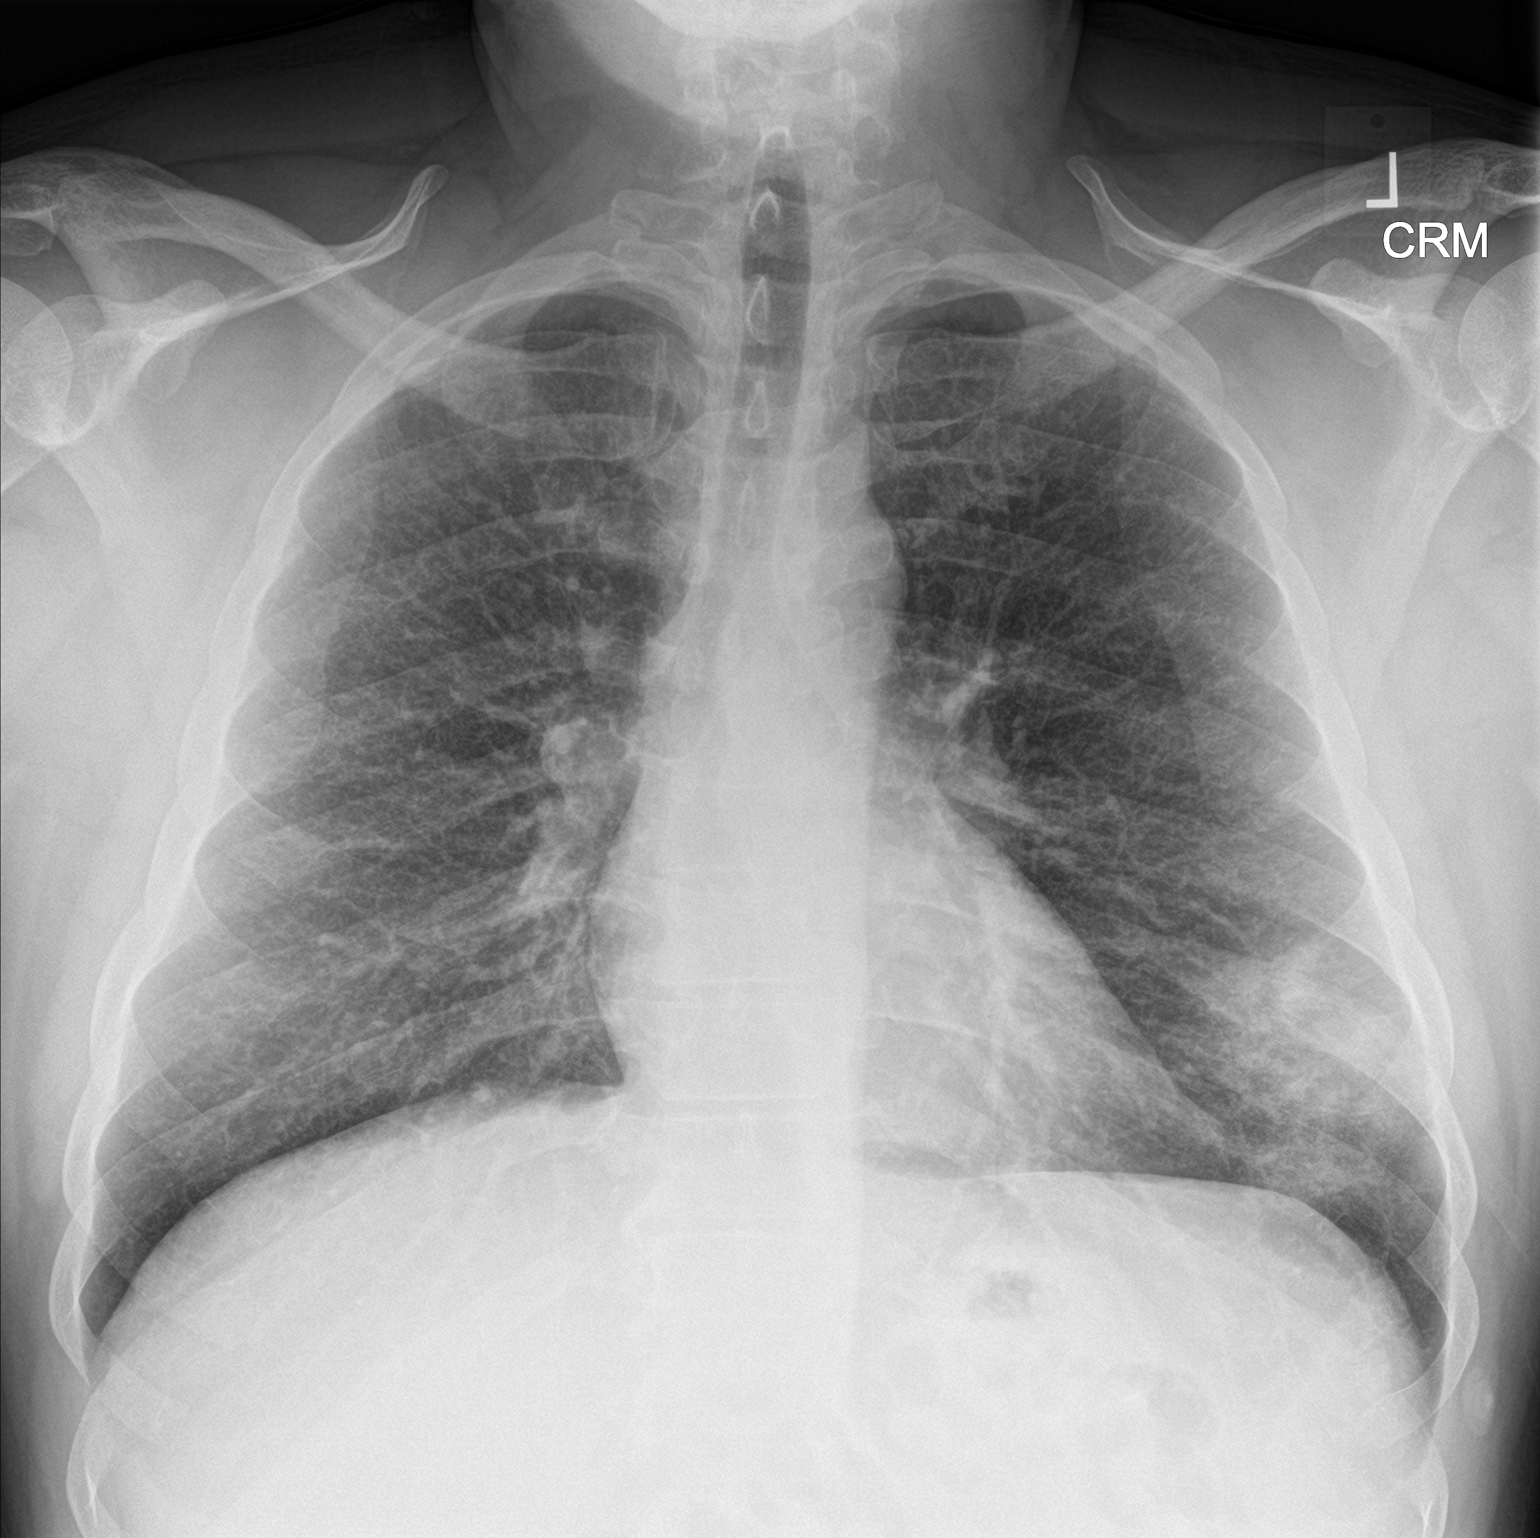

[chest lat]
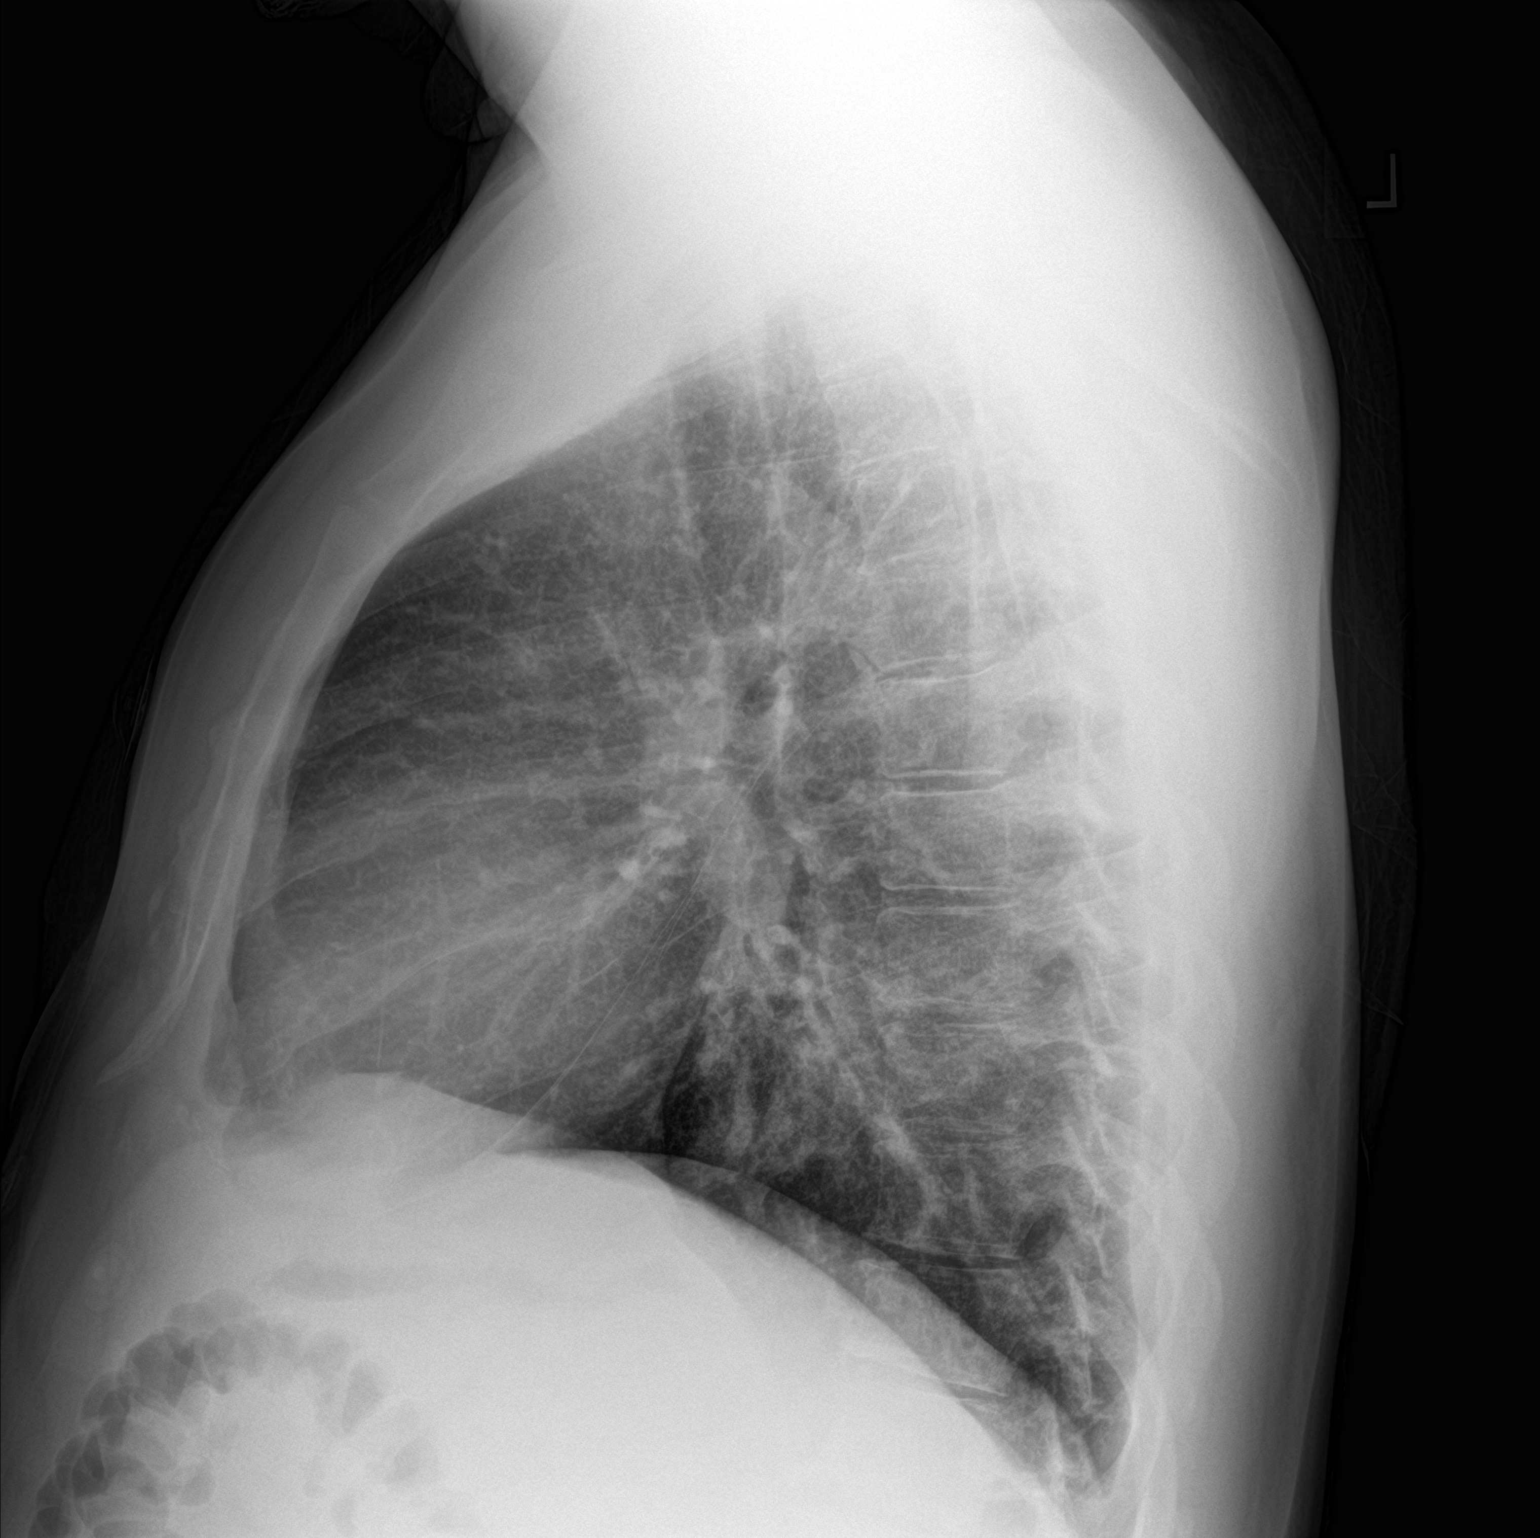

[2 of 2 positions shown; findings below may reference images not displayed]

FINDINGS: The heart size and mediastinal contours are within normal limits.

Left lower lung zone peripheral focal consolidation. Question right
peripheral lower lung zone vague focal consolidation. Diffusely
coarsened interstitial markings with no overt pulmonary edema. No
pleural effusion. No pneumothorax.

No acute osseous abnormality.
IMPRESSION: Left lower lung zone pneumonia, possibly multifocal. Followup PA and
lateral chest X-ray is recommended in 3-4 weeks following therapy to
ensure resolution and exclude underlying malignancy.

## 2023-01-04 LAB — BASIC METABOLIC PANEL
BUN/Creatinine Ratio: 11 (ref 9–20)
BUN: 10 mg/dL (ref 6–24)
CO2: 24 mmol/L (ref 20–29)
Calcium: 8.9 mg/dL (ref 8.7–10.2)
Chloride: 105 mmol/L (ref 96–106)
Creatinine, Ser: 0.93 mg/dL (ref 0.76–1.27)
Glucose: 108 mg/dL — ABNORMAL HIGH (ref 70–99)
Potassium: 3.8 mmol/L (ref 3.5–5.2)
Sodium: 144 mmol/L (ref 134–144)
eGFR: 102 mL/min/{1.73_m2} (ref >59)

## 2023-01-04 LAB — ALDOSTERONE + RENIN ACTIVITY W/ RATIO
Aldos/Renin Ratio: 3 (ref 0.0–30.0)
Aldosterone: 2.4 ng/dL (ref 0.0–30.0)
Renin Activity, Plasma: 0.795 ng/mL/h (ref 0.167–5.380)

## 2023-01-10 ENCOUNTER — Other Ambulatory Visit (INDEPENDENT_AMBULATORY_CARE_PROVIDER_SITE_OTHER): Payer: 59 | Admitting: Pharmacist

## 2023-01-10 DIAGNOSIS — I10 Essential (primary) hypertension: Secondary | ICD-10-CM

## 2023-01-10 NOTE — Progress Notes (Signed)
   01/10/2023 Name: Victor Dominguez MRN: [id number] DOB: 03/23/1975  Chief Complaint  Patient presents with   Hypertension   Medication Management    Victor Dominguez is a 47 y.o. year old male who presented for a telephone visit.   They were referred to the pharmacist by their PCP for assistance in managing hypertension.    Subjective:  Care Team: Primary Care Provider: Donell Beers, FNP ; Next Scheduled Visit: 03/2022  Medication Access/Adherence  Current Pharmacy:  [id number] [place], [place] - [address] [facility] [address] [place] [address] Phone: [phone] Fax: [phone]  [facility] [address] [place] [address] Phone: [phone] Fax: [phone]   Patient reports affordability concerns with their medications: No  Patient reports access/transportation concerns to their pharmacy: No  Patient reports adherence concerns with their medications:  No     Hypertension:  Current medications: amlodipine 10 mg daily, valsartan 160 mg daily, hydrochlorothiazide 12.5 mg daily  Patient has a validated, automated, upper arm home BP cuff Current blood pressure readings readings: 127/85  Patient denies hypotensive s/sx including dizziness, lightheadedness.  Patient denies hypertensive symptoms including headache, chest pain, shortness of breath  Objective:  Lab Results  Component Value Date   HGBA1C 5.1 11/02/2022    Lab Results  Component Value Date   CREATININE 0.93 12/29/2022   BUN 10 12/29/2022   NA 144 12/29/2022   K 3.8 12/29/2022   CL 105 12/29/2022   CO2 24 12/29/2022    Lab Results  Component Value Date   CHOL 132 11/02/2022   HDL 27 (L) 11/02/2022   LDLCALC 50 11/02/2022   TRIG 359 (H) 11/02/2022   CHOLHDL 4.9 11/02/2022    Medications Reviewed Today     Reviewed by Alden Hipp, RPH-CPP (Pharmacist) on 01/10/23 at 1410  Med  List Status: <None>   Medication Order Taking? Sig Documenting Provider Last Dose Status Informant  amLODipine (NORVASC) 10 MG tablet [id number] Yes Take 1 tablet (10 mg total) by mouth daily. Donell Beers, FNP Taking Active   hydrochlorothiazide (HYDRODIURIL) 12.5 MG tablet [id number] Yes Take 1 tablet (12.5 mg total) by mouth daily. Donell Beers, FNP Taking Active   valsartan (DIOVAN) 160 MG tablet [id number] Yes Take 1 tablet (160 mg total) by mouth daily. Donell Beers, FNP Taking Active              Assessment/Plan:   Hypertension: - Currently controlled - Reviewed long term cardiovascular and renal outcomes of uncontrolled blood pressure - Reviewed appropriate blood pressure monitoring technique and reviewed goal blood pressure. Recommended to check home blood pressure and heart rate periodically - Recommend to continue current regimen at this time    Follow Up Plan: follow up with PCP as scheduled  Catie Eppie Gibson, PharmD, BCACP, CPP Clinical Pharmacist [facility] [phone]

## 2023-03-29 ENCOUNTER — Ambulatory Visit: Payer: Self-pay | Admitting: Nurse Practitioner

## 2023-04-10 ENCOUNTER — Ambulatory Visit: Payer: Self-pay | Admitting: Nurse Practitioner

## 2023-08-04 ENCOUNTER — Other Ambulatory Visit: Payer: Self-pay | Admitting: Nurse Practitioner

## 2023-08-04 DIAGNOSIS — I10 Essential (primary) hypertension: Secondary | ICD-10-CM

## 2023-12-10 ENCOUNTER — Other Ambulatory Visit: Payer: Self-pay | Admitting: Nurse Practitioner

## 2024-01-21 ENCOUNTER — Other Ambulatory Visit: Payer: Self-pay | Admitting: Nurse Practitioner

## 2024-01-21 DIAGNOSIS — I10 Essential (primary) hypertension: Secondary | ICD-10-CM

## 2024-02-24 ENCOUNTER — Other Ambulatory Visit: Payer: Self-pay | Admitting: Nurse Practitioner

## 2024-02-24 DIAGNOSIS — I10 Essential (primary) hypertension: Secondary | ICD-10-CM

## 2024-02-26 NOTE — Telephone Encounter (Signed)
 amLODipine  (NORVASC ) 10 MG tablet [Pharmacy Med Name: AMLODIPINE  BESYLATE 10 MG TAB]
